# Patient Record
Sex: Female | Born: 1950 | Race: White | Hispanic: No | Marital: Married | Smoking: Never smoker
Health system: Southern US, Community
[De-identification: ages and names within clinical notes are randomized; demographics above are authoritative.]

## PROBLEM LIST (undated history)

## (undated) DIAGNOSIS — M1990 Unspecified osteoarthritis, unspecified site: Secondary | ICD-10-CM

## (undated) DIAGNOSIS — Z8619 Personal history of other infectious and parasitic diseases: Secondary | ICD-10-CM

## (undated) DIAGNOSIS — R011 Cardiac murmur, unspecified: Secondary | ICD-10-CM

## (undated) HISTORY — PX: DILATION AND CURETTAGE OF UTERUS: SHX78

## (undated) HISTORY — DX: Cardiac murmur, unspecified: R01.1

## (undated) HISTORY — DX: Personal history of other infectious and parasitic diseases: Z86.19

## (undated) HISTORY — DX: Unspecified osteoarthritis, unspecified site: M19.90

---

## 1998-08-13 ENCOUNTER — Other Ambulatory Visit: Admission: RE | Admit: 1998-08-13 | Discharge: 1998-08-13 | Payer: Self-pay | Admitting: *Deleted

## 1999-07-02 ENCOUNTER — Encounter: Admission: RE | Admit: 1999-07-02 | Discharge: 1999-07-02 | Payer: Self-pay | Admitting: *Deleted

## 1999-07-02 ENCOUNTER — Encounter: Payer: Self-pay | Admitting: *Deleted

## 1999-08-20 ENCOUNTER — Other Ambulatory Visit: Admission: RE | Admit: 1999-08-20 | Discharge: 1999-08-20 | Payer: Self-pay | Admitting: *Deleted

## 2000-07-04 ENCOUNTER — Encounter: Payer: Self-pay | Admitting: *Deleted

## 2000-07-04 ENCOUNTER — Encounter: Admission: RE | Admit: 2000-07-04 | Discharge: 2000-07-04 | Payer: Self-pay | Admitting: *Deleted

## 2000-08-19 ENCOUNTER — Other Ambulatory Visit: Admission: RE | Admit: 2000-08-19 | Discharge: 2000-08-19 | Payer: Self-pay | Admitting: *Deleted

## 2001-07-05 ENCOUNTER — Encounter: Admission: RE | Admit: 2001-07-05 | Discharge: 2001-07-05 | Payer: Self-pay | Admitting: *Deleted

## 2001-07-05 ENCOUNTER — Encounter: Payer: Self-pay | Admitting: *Deleted

## 2001-07-11 ENCOUNTER — Other Ambulatory Visit: Admission: RE | Admit: 2001-07-11 | Discharge: 2001-07-11 | Payer: Self-pay | Admitting: *Deleted

## 2002-02-22 LAB — HM COLONOSCOPY: HM Colonoscopy: NORMAL

## 2002-07-12 ENCOUNTER — Encounter: Payer: Self-pay | Admitting: Obstetrics and Gynecology

## 2002-07-12 ENCOUNTER — Encounter: Admission: RE | Admit: 2002-07-12 | Discharge: 2002-07-12 | Payer: Self-pay | Admitting: Obstetrics and Gynecology

## 2002-07-17 ENCOUNTER — Other Ambulatory Visit: Admission: RE | Admit: 2002-07-17 | Discharge: 2002-07-17 | Payer: Self-pay | Admitting: Obstetrics and Gynecology

## 2003-07-18 ENCOUNTER — Encounter: Admission: RE | Admit: 2003-07-18 | Discharge: 2003-07-18 | Payer: Self-pay | Admitting: Obstetrics and Gynecology

## 2003-07-23 ENCOUNTER — Other Ambulatory Visit: Admission: RE | Admit: 2003-07-23 | Discharge: 2003-07-23 | Payer: Self-pay | Admitting: Obstetrics and Gynecology

## 2004-07-21 ENCOUNTER — Encounter: Admission: RE | Admit: 2004-07-21 | Discharge: 2004-07-21 | Payer: Self-pay | Admitting: Obstetrics and Gynecology

## 2004-08-03 ENCOUNTER — Other Ambulatory Visit: Admission: RE | Admit: 2004-08-03 | Discharge: 2004-08-03 | Payer: Self-pay | Admitting: Obstetrics and Gynecology

## 2004-08-13 ENCOUNTER — Encounter: Admission: RE | Admit: 2004-08-13 | Discharge: 2004-08-13 | Payer: Self-pay | Admitting: Obstetrics and Gynecology

## 2005-05-03 ENCOUNTER — Ambulatory Visit (HOSPITAL_COMMUNITY): Admission: RE | Admit: 2005-05-03 | Discharge: 2005-05-03 | Payer: Self-pay | Admitting: Obstetrics and Gynecology

## 2005-07-26 ENCOUNTER — Encounter: Admission: RE | Admit: 2005-07-26 | Discharge: 2005-07-26 | Payer: Self-pay | Admitting: Obstetrics and Gynecology

## 2005-09-29 ENCOUNTER — Other Ambulatory Visit: Admission: RE | Admit: 2005-09-29 | Discharge: 2005-09-29 | Payer: Self-pay | Admitting: Obstetrics and Gynecology

## 2006-08-19 ENCOUNTER — Encounter: Admission: RE | Admit: 2006-08-19 | Discharge: 2006-08-19 | Payer: Self-pay | Admitting: Obstetrics and Gynecology

## 2006-10-21 ENCOUNTER — Other Ambulatory Visit: Admission: RE | Admit: 2006-10-21 | Discharge: 2006-10-21 | Payer: Self-pay | Admitting: Obstetrics and Gynecology

## 2007-08-21 ENCOUNTER — Encounter: Admission: RE | Admit: 2007-08-21 | Discharge: 2007-08-21 | Payer: Self-pay | Admitting: Obstetrics and Gynecology

## 2007-10-23 ENCOUNTER — Other Ambulatory Visit: Admission: RE | Admit: 2007-10-23 | Discharge: 2007-10-23 | Payer: Self-pay | Admitting: Obstetrics and Gynecology

## 2008-09-03 ENCOUNTER — Encounter: Admission: RE | Admit: 2008-09-03 | Discharge: 2008-09-03 | Payer: Self-pay | Admitting: Obstetrics and Gynecology

## 2008-10-23 ENCOUNTER — Other Ambulatory Visit: Admission: RE | Admit: 2008-10-23 | Discharge: 2008-10-23 | Payer: Self-pay | Admitting: Obstetrics and Gynecology

## 2009-09-08 ENCOUNTER — Encounter: Admission: RE | Admit: 2009-09-08 | Discharge: 2009-09-08 | Payer: Self-pay | Admitting: Obstetrics and Gynecology

## 2009-10-24 ENCOUNTER — Other Ambulatory Visit: Admission: RE | Admit: 2009-10-24 | Discharge: 2009-10-24 | Payer: Self-pay | Admitting: Obstetrics and Gynecology

## 2010-09-02 ENCOUNTER — Other Ambulatory Visit: Payer: Self-pay | Admitting: Obstetrics and Gynecology

## 2010-09-02 DIAGNOSIS — Z1231 Encounter for screening mammogram for malignant neoplasm of breast: Secondary | ICD-10-CM

## 2010-09-14 ENCOUNTER — Ambulatory Visit
Admission: RE | Admit: 2010-09-14 | Discharge: 2010-09-14 | Disposition: A | Discharge status: Home / Self Care | Payer: BC Managed Care – PPO | Source: Ambulatory Visit | Attending: Obstetrics and Gynecology | Admitting: Obstetrics and Gynecology

## 2010-09-14 DIAGNOSIS — Z1231 Encounter for screening mammogram for malignant neoplasm of breast: Secondary | ICD-10-CM

## 2010-09-27 LAB — HM MAMMOGRAPHY: HM Mammogram: NEGATIVE

## 2010-11-23 ENCOUNTER — Other Ambulatory Visit (HOSPITAL_COMMUNITY)
Admission: RE | Admit: 2010-11-23 | Discharge: 2010-11-23 | Disposition: A | Discharge status: Home / Self Care | Payer: Self-pay | Source: Ambulatory Visit | Attending: Obstetrics and Gynecology | Admitting: Obstetrics and Gynecology

## 2010-11-23 ENCOUNTER — Other Ambulatory Visit: Payer: Self-pay | Admitting: Obstetrics and Gynecology

## 2010-11-23 DIAGNOSIS — Z01419 Encounter for gynecological examination (general) (routine) without abnormal findings: Secondary | ICD-10-CM | POA: Insufficient documentation

## 2011-03-25 ENCOUNTER — Other Ambulatory Visit: Payer: Self-pay | Admitting: Dermatology

## 2011-05-03 ENCOUNTER — Other Ambulatory Visit: Payer: Self-pay | Admitting: Dermatology

## 2011-08-18 ENCOUNTER — Other Ambulatory Visit: Payer: Self-pay | Admitting: Obstetrics and Gynecology

## 2011-08-18 DIAGNOSIS — Z1231 Encounter for screening mammogram for malignant neoplasm of breast: Secondary | ICD-10-CM

## 2011-09-17 ENCOUNTER — Ambulatory Visit: Payer: Self-pay

## 2011-09-24 ENCOUNTER — Ambulatory Visit: Payer: Self-pay

## 2011-09-27 ENCOUNTER — Encounter: Payer: Self-pay | Admitting: Internal Medicine

## 2011-09-27 ENCOUNTER — Ambulatory Visit (INDEPENDENT_AMBULATORY_CARE_PROVIDER_SITE_OTHER): Payer: BC Managed Care – PPO | Admitting: Internal Medicine

## 2011-09-27 VITALS — BP 98/64 | HR 80 | Temp 98.0°F | Resp 16 | Ht 66.0 in | Wt 171.0 lb

## 2011-09-27 DIAGNOSIS — Z0000 Encounter for general adult medical examination without abnormal findings: Secondary | ICD-10-CM

## 2011-09-27 LAB — COMPREHENSIVE METABOLIC PANEL
AST: 22 U/L (ref 0–37)
Alkaline Phosphatase: 50 U/L (ref 39–117)
BUN: 19 mg/dL (ref 6–23)
Chloride: 104 mEq/L (ref 96–112)
Creatinine, Ser: 0.8 mg/dL (ref 0.4–1.2)
Glucose, Bld: 103 mg/dL — ABNORMAL HIGH (ref 70–99)
Total Protein: 7.1 g/dL (ref 6.0–8.3)

## 2011-09-27 LAB — LIPID PANEL: HDL: 65.3 mg/dL (ref >39.00)

## 2011-09-27 LAB — CBC WITH DIFFERENTIAL/PLATELET
Basophils Absolute: 0 10*3/uL (ref 0.0–0.1)
Eosinophils Absolute: 0.1 10*3/uL (ref 0.0–0.7)
Eosinophils Relative: 1.5 % (ref 0.0–5.0)
HCT: 40.2 % (ref 36.0–46.0)
MCHC: 34.4 g/dL (ref 30.0–36.0)
Monocytes Relative: 7.1 % (ref 3.0–12.0)
Neutrophils Relative %: 49.5 % (ref 43.0–77.0)
Platelets: 224 10*3/uL (ref 150.0–400.0)

## 2011-09-27 NOTE — Patient Instructions (Addendum)
Limit your sodium (Salt) intake    It is important that you exercise regularly, at least 20 minutes 3 to 4 times per week.  If you develop chest pain or shortness of breath seek  medical attention. 

## 2011-09-28 ENCOUNTER — Encounter: Payer: Self-pay | Admitting: Internal Medicine

## 2011-09-28 LAB — TSH: TSH: 1.2 u[IU]/mL (ref 0.35–5.50)

## 2011-09-28 NOTE — Progress Notes (Signed)
Subjective:    Patient ID: Stacy Sherman, female    DOB: 1951-01-15, 61 y.o.   MRN: [id number]  HPI  61 year old patient who is seen today to establish with our practice. She enjoys remarkably good health and is followed annually by gynecology. She has had a screening colonoscopy in 2004. No real concerns or complaints. She takes no chronic medications. She is also followed by ophthalmology [facility]le Quant) due  to glaucoma which has been well-controlled.  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 She is a gravida 3 para 2 abortus 1 Surgical procedures have included a laparoscopy in 1988 as part of a infertility evaluation she had a D&C also 61 and normal childbirths  In 2 and 61    social history. 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 Married 2 children nonsmoker  Family history father age 10 in remarkably good health. Mother died age 40 complications of diabetes and chronic kidney disease she was a hemodialysis patient. She remote history of breast cancer late onset as well as obesity and osteoarthritis 3 brothers 2 sisters one sister with abdominal aortic aneurysm as well as a cerebral aneurysm no other family history of cerebral aneurysms also positive for hypertension    Review of Systems  Constitutional: Negative for fever, appetite change, fatigue and unexpected weight change.  HENT: Negative for hearing loss, ear pain, nosebleeds, congestion, sore throat, mouth sores, trouble swallowing, neck stiffness, dental problem, voice change, sinus pressure and tinnitus.   Eyes: Negative for photophobia, pain, redness and visual disturbance.  Respiratory: Negative for cough, chest tightness and shortness of breath.   Cardiovascular: Negative for chest pain, palpitations and leg swelling.  Gastrointestinal: Negative for nausea, vomiting, abdominal pain, diarrhea, constipation, blood in stool, abdominal distention and rectal pain.  Genitourinary: Negative for  dysuria, urgency, frequency, hematuria, flank pain, vaginal bleeding, vaginal discharge, difficulty urinating, genital sores, vaginal pain, menstrual problem and pelvic pain.  Musculoskeletal: Negative for back pain and arthralgias.  Skin: Negative for rash.  Neurological: Negative for dizziness, syncope, speech difficulty, weakness, light-headedness, numbness and headaches.  Hematological: Negative for adenopathy. Does not bruise/bleed easily.  Psychiatric/Behavioral: Negative for suicidal ideas, behavioral problems, self-injury, dysphoric mood and agitation. The patient is not nervous/anxious.        Objective:   Physical Exam  Constitutional: She is oriented to person, place, and time. She appears well-developed and well-nourished.  HENT:  Head: Normocephalic and atraumatic.  Right Ear: External ear normal.  Left Ear: External ear normal.  Mouth/Throat: Oropharynx is clear and moist.  Eyes: Conjunctivae and EOM are normal.  Neck: Normal range of motion. Neck supple. No JVD present. No thyromegaly present.  Cardiovascular: Normal rate, regular rhythm, normal heart sounds and intact distal pulses.   No murmur heard. Pulmonary/Chest: Effort normal and breath sounds normal. She has no wheezes. She has no rales.  Abdominal: Soft. Bowel sounds are normal. She exhibits no distension and no mass. There is no tenderness. There is no rebound and no guarding.  Musculoskeletal: Normal range of motion. She exhibits no edema and no tenderness.  Neurological: She is alert and oriented to person, place, and time. She has normal reflexes. No cranial nerve deficit. She exhibits normal muscle tone. Coordination normal.  Skin: Skin is warm and dry. No rash noted.  Psychiatric: She has a normal mood and affect. Her behavior is normal.          Assessment & Plan:  Preventive health examination.  We'll continue  annual gynecologic followup. We'll schedule a followup colonoscopy or possibly one  year Regular exercise encouraged Return in one year or as needed Laboratory update will be reviewed

## 2011-09-28 NOTE — Progress Notes (Signed)
Quick Note:  Spoke with pt- informed labs normal ______ 

## 2011-10-01 ENCOUNTER — Ambulatory Visit
Admission: RE | Admit: 2011-10-01 | Discharge: 2011-10-01 | Disposition: A | Discharge status: Home / Self Care | Payer: BC Managed Care – PPO | Source: Ambulatory Visit | Attending: Obstetrics and Gynecology | Admitting: Obstetrics and Gynecology

## 2011-10-01 DIAGNOSIS — Z1231 Encounter for screening mammogram for malignant neoplasm of breast: Secondary | ICD-10-CM

## 2011-11-23 ENCOUNTER — Other Ambulatory Visit (HOSPITAL_COMMUNITY)
Admission: RE | Admit: 2011-11-23 | Discharge: 2011-11-23 | Disposition: A | Discharge status: Home / Self Care | Payer: BC Managed Care – PPO | Source: Ambulatory Visit | Attending: Obstetrics and Gynecology | Admitting: Obstetrics and Gynecology

## 2011-11-23 ENCOUNTER — Other Ambulatory Visit: Payer: Self-pay | Admitting: Obstetrics and Gynecology

## 2011-11-23 DIAGNOSIS — Z01419 Encounter for gynecological examination (general) (routine) without abnormal findings: Secondary | ICD-10-CM | POA: Insufficient documentation

## 2012-05-11 ENCOUNTER — Other Ambulatory Visit: Payer: Self-pay | Admitting: Dermatology

## 2012-09-12 ENCOUNTER — Other Ambulatory Visit: Payer: Self-pay

## 2012-09-12 DIAGNOSIS — Z1231 Encounter for screening mammogram for malignant neoplasm of breast: Secondary | ICD-10-CM

## 2012-10-03 ENCOUNTER — Ambulatory Visit
Admission: RE | Admit: 2012-10-03 | Discharge: 2012-10-03 | Disposition: A | Discharge status: Home / Self Care | Payer: BC Managed Care – PPO | Source: Ambulatory Visit

## 2012-10-03 DIAGNOSIS — Z1231 Encounter for screening mammogram for malignant neoplasm of breast: Secondary | ICD-10-CM

## 2012-10-05 ENCOUNTER — Other Ambulatory Visit (INDEPENDENT_AMBULATORY_CARE_PROVIDER_SITE_OTHER): Payer: BC Managed Care – PPO

## 2012-10-05 DIAGNOSIS — R7989 Other specified abnormal findings of blood chemistry: Secondary | ICD-10-CM

## 2012-10-05 DIAGNOSIS — Z0000 Encounter for general adult medical examination without abnormal findings: Secondary | ICD-10-CM

## 2012-10-05 LAB — BASIC METABOLIC PANEL
CO2: 27 mEq/L (ref 19–32)
Calcium: 9.2 mg/dL (ref 8.4–10.5)
GFR: 84.5 mL/min (ref >60.00)
Glucose, Bld: 88 mg/dL (ref 70–99)
Potassium: 4.2 mEq/L (ref 3.5–5.1)

## 2012-10-05 LAB — CBC WITH DIFFERENTIAL/PLATELET
Eosinophils Absolute: 0.1 10*3/uL (ref 0.0–0.7)
Eosinophils Relative: 1.6 % (ref 0.0–5.0)
HCT: 41 % (ref 36.0–46.0)
Hemoglobin: 13.9 g/dL (ref 12.0–15.0)
MCHC: 33.9 g/dL (ref 30.0–36.0)
RBC: 4.19 Mil/uL (ref 3.87–5.11)
WBC: 4.7 10*3/uL (ref 4.5–10.5)

## 2012-10-05 LAB — POCT URINALYSIS DIPSTICK
Bilirubin, UA: NEGATIVE
Nitrite, UA: NEGATIVE
Protein, UA: NEGATIVE
Spec Grav, UA: 1.02
Urobilinogen, UA: 0.2

## 2012-10-05 LAB — HEPATIC FUNCTION PANEL
Alkaline Phosphatase: 50 U/L (ref 39–117)
Bilirubin, Direct: 0.1 mg/dL (ref 0.0–0.3)

## 2012-10-05 LAB — TSH: TSH: 2.17 u[IU]/mL (ref 0.35–5.50)

## 2012-10-05 LAB — LIPID PANEL
Cholesterol: 215 mg/dL — ABNORMAL HIGH (ref 0–200)
Total CHOL/HDL Ratio: 4

## 2012-10-16 ENCOUNTER — Encounter: Payer: Self-pay | Admitting: Internal Medicine

## 2012-10-16 ENCOUNTER — Ambulatory Visit (INDEPENDENT_AMBULATORY_CARE_PROVIDER_SITE_OTHER): Payer: BC Managed Care – PPO | Admitting: Internal Medicine

## 2012-10-16 VITALS — BP 118/80 | HR 64 | Temp 98.2°F | Resp 20 | Ht 66.0 in | Wt 165.0 lb

## 2012-10-16 DIAGNOSIS — Z0000 Encounter for general adult medical examination without abnormal findings: Secondary | ICD-10-CM

## 2012-10-16 MED ORDER — MOMETASONE FUROATE 50 MCG/ACT NA SUSP: 2.0000 | Freq: Every day | NASAL | Status: DC

## 2012-10-16 NOTE — Patient Instructions (Addendum)
It is important that you exercise regularly, at least 20 minutes 3 to 4 times per week.  If you develop chest pain or shortness of breath seek  medical attention.  Schedule your colonoscopy to help detect colon cancer.  Health Maintenance, Females A healthy lifestyle and preventative care can promote health and wellness.  Maintain regular health, dental, and eye exams.  Eat a healthy diet. Foods like vegetables, fruits, whole grains, low-fat dairy products, and lean protein foods contain the nutrients you need without too many calories. Decrease your intake of foods high in solid fats, added sugars, and salt. Get information about a proper diet from your caregiver, if necessary.  Regular physical exercise is one of the most important things you can do for your health. Most adults should get at least 150 minutes of moderate-intensity exercise (any activity that increases your heart rate and causes you to sweat) each week. In addition, most adults need muscle-strengthening exercises on 2 or more days a week.   Maintain a healthy weight. The body mass index (BMI) is a screening tool to identify possible weight problems. It provides an estimate of body fat based on height and weight. Your caregiver can help determine your BMI, and can help you achieve or maintain a healthy weight. For adults 20 years and older:  A BMI below 18.5 is considered underweight.  A BMI of 18.5 to 24.9 is normal.  A BMI of 25 to 29.9 is considered overweight.  A BMI of 30 and above is considered obese.  Maintain normal blood lipids and cholesterol by exercising and minimizing your intake of saturated fat. Eat a balanced diet with plenty of fruits and vegetables. Blood tests for lipids and cholesterol should begin at age 57 and be repeated every 5 years. If your lipid or cholesterol levels are high, you are over 50, or you are a high risk for heart disease, you may need your cholesterol levels checked more  frequently.Ongoing high lipid and cholesterol levels should be treated with medicines if diet and exercise are not effective.  If you smoke, find out from your caregiver how to quit. If you do not use tobacco, do not start.  If you are pregnant, do not drink alcohol. If you are breastfeeding, be very cautious about drinking alcohol. If you are not pregnant and choose to drink alcohol, do not exceed 1 drink per day. One drink is considered to be 12 ounces (355 mL) of beer, 5 ounces (148 mL) of wine, or 1.5 ounces (44 mL) of liquor.  Avoid use of street drugs. Do not share needles with anyone. Ask for help if you need support or instructions about stopping the use of drugs.  High blood pressure causes heart disease and increases the risk of stroke. Blood pressure should be checked at least every 1 to 2 years. Ongoing high blood pressure should be treated with medicines, if weight loss and exercise are not effective.  If you are 29 to 62 years old, ask your caregiver if you should take aspirin to prevent strokes.  Diabetes screening involves taking a blood sample to check your fasting blood sugar level. This should be done once every 3 years, after age 48, if you are within normal weight and without risk factors for diabetes. Testing should be considered at a younger age or be carried out more frequently if you are overweight and have at least 1 risk factor for diabetes.  Breast cancer screening is essential preventative care for women. You  should practice "breast self-awareness." This means understanding the normal appearance and feel of your breasts and may include breast self-examination. Any changes detected, no matter how small, should be reported to a caregiver. Women in their 61s and 30s should have a clinical breast exam (CBE) by a caregiver as part of a regular health exam every 1 to 3 years. After age 79, women should have a CBE every year. Starting at age 71, women should consider having a  mammogram (breast X-ray) every year. Women who have a family history of breast cancer should talk to their caregiver about genetic screening. Women at a high risk of breast cancer should talk to their caregiver about having an MRI and a mammogram every year.  The Pap test is a screening test for cervical cancer. Women should have a Pap test starting at age 63. Between ages 40 and 70, Pap tests should be repeated every 2 years. Beginning at age 59, you should have a Pap test every 3 years as long as the past 3 Pap tests have been normal. If you had a hysterectomy for a problem that was not cancer or a condition that could lead to cancer, then you no longer need Pap tests. If you are between ages 73 and 16, and you have had normal Pap tests going back 10 years, you no longer need Pap tests. If you have had past treatment for cervical cancer or a condition that could lead to cancer, you need Pap tests and screening for cancer for at least 20 years after your treatment. If Pap tests have been discontinued, risk factors (such as a new sexual partner) need to be reassessed to determine if screening should be resumed. Some women have medical problems that increase the chance of getting cervical cancer. In these cases, your caregiver may recommend more frequent screening and Pap tests.  The human papillomavirus (HPV) test is an additional test that may be used for cervical cancer screening. The HPV test looks for the virus that can cause the cell changes on the cervix. The cells collected during the Pap test can be tested for HPV. The HPV test could be used to screen women aged 35 years and older, and should be used in women of any age who have unclear Pap test results. After the age of 59, women should have HPV testing at the same frequency as a Pap test.  Colorectal cancer can be detected and often prevented. Most routine colorectal cancer screening begins at the age of 86 and continues through age 25. However, your  caregiver may recommend screening at an earlier age if you have risk factors for colon cancer. On a yearly basis, your caregiver may provide home test kits to check for hidden blood in the stool. Use of a small camera at the end of a tube, to directly examine the colon (sigmoidoscopy or colonoscopy), can detect the earliest forms of colorectal cancer. Talk to your caregiver about this at age 41, when routine screening begins. Direct examination of the colon should be repeated every 5 to 10 years through age 52, unless early forms of pre-cancerous polyps or small growths are found.  Hepatitis C blood testing is recommended for all people born from [birth year] through 1965 and any individual with known risks for hepatitis C.  Practice safe sex. Use condoms and avoid high-risk sexual practices to reduce the spread of sexually transmitted infections (STIs). Sexually active women aged 68 and younger should be checked for Chlamydia, which is  a common sexually transmitted infection. Older women with new or multiple partners should also be tested for Chlamydia. Testing for other STIs is recommended if you are sexually active and at increased risk.  Osteoporosis is a disease in which the bones lose minerals and strength with aging. This can result in serious bone fractures. The risk of osteoporosis can be identified using a bone density scan. Women ages 48 and over and women at risk for fractures or osteoporosis should discuss screening with their caregivers. Ask your caregiver whether you should be taking a calcium supplement or vitamin D to reduce the rate of osteoporosis.  Menopause can be associated with physical symptoms and risks. Hormone replacement therapy is available to decrease symptoms and risks. You should talk to your caregiver about whether hormone replacement therapy is right for you.  Use sunscreen with a sun protection factor (SPF) of 30 or greater. Apply sunscreen liberally and repeatedly throughout the  day. You should seek shade when your shadow is shorter than you. Protect yourself by wearing long sleeves, pants, a wide-brimmed hat, and sunglasses year round, whenever you are outdoors.  Notify your caregiver of new moles or changes in moles, especially if there is a change in shape or color. Also notify your caregiver if a mole is larger than the size of a pencil eraser.  Stay current with your immunizations. Document Released: 08/24/2010 Document Revised: 05/03/2011 Document Reviewed: 08/24/2010 [facility] Patient Information 2014 [place], [place]. Preventive Care for Adults, Female A healthy lifestyle and preventive care can promote health and wellness. Preventive health guidelines for women include the following key practices.  A routine yearly physical is a good way to check with your caregiver about your health and preventive screening. It is a chance to share any concerns and updates on your health, and to receive a thorough exam.  Visit your dentist for a routine exam and preventive care every 6 months. Brush your teeth twice a day and floss once a day. Good oral hygiene prevents tooth decay and gum disease.  The frequency of eye exams is based on your age, health, family medical history, use of contact lenses, and other factors. Follow your caregiver's recommendations for frequency of eye exams.  Eat a healthy diet. Foods like vegetables, fruits, whole grains, low-fat dairy products, and lean protein foods contain the nutrients you need without too many calories. Decrease your intake of foods high in solid fats, added sugars, and salt. Eat the right amount of calories for you.Get information about a proper diet from your caregiver, if necessary.  Regular physical exercise is one of the most important things you can do for your health. Most adults should get at least 150 minutes of moderate-intensity exercise (any activity that increases your heart rate and causes you to sweat) each week.  In addition, most adults need muscle-strengthening exercises on 2 or more days a week.  Maintain a healthy weight. The body mass index (BMI) is a screening tool to identify possible weight problems. It provides an estimate of body fat based on height and weight. Your caregiver can help determine your BMI, and can help you achieve or maintain a healthy weight.For adults 20 years and older:  A BMI below 18.5 is considered underweight.  A BMI of 18.5 to 24.9 is normal.  A BMI of 25 to 29.9 is considered overweight.  A BMI of 30 and above is considered obese.  Maintain normal blood lipids and cholesterol levels by exercising and minimizing your intake of  saturated fat. Eat a balanced diet with plenty of fruit and vegetables. Blood tests for lipids and cholesterol should begin at age 17 and be repeated every 5 years. If your lipid or cholesterol levels are high, you are over 50, or you are at high risk for heart disease, you may need your cholesterol levels checked more frequently.Ongoing high lipid and cholesterol levels should be treated with medicines if diet and exercise are not effective.  If you smoke, find out from your caregiver how to quit. If you do not use tobacco, do not start.  If you are pregnant, do not drink alcohol. If you are breastfeeding, be very cautious about drinking alcohol. If you are not pregnant and choose to drink alcohol, do not exceed 1 drink per day. One drink is considered to be 12 ounces (355 mL) of beer, 5 ounces (148 mL) of wine, or 1.5 ounces (44 mL) of liquor.  Avoid use of street drugs. Do not share needles with anyone. Ask for help if you need support or instructions about stopping the use of drugs.  High blood pressure causes heart disease and increases the risk of stroke. Your blood pressure should be checked at least every 1 to 2 years. Ongoing high blood pressure should be treated with medicines if weight loss and exercise are not effective.  If you are  12 to 62 years old, ask your caregiver if you should take aspirin to prevent strokes.  Diabetes screening involves taking a blood sample to check your fasting blood sugar level. This should be done once every 3 years, after age 3, if you are within normal weight and without risk factors for diabetes. Testing should be considered at a younger age or be carried out more frequently if you are overweight and have at least 1 risk factor for diabetes.  Breast cancer screening is essential preventive care for women. You should practice "breast self-awareness." This means understanding the normal appearance and feel of your breasts and may include breast self-examination. Any changes detected, no matter how small, should be reported to a caregiver. Women in their 74s and 30s should have a clinical breast exam (CBE) by a caregiver as part of a regular health exam every 1 to 3 years. After age 57, women should have a CBE every year. Starting at age 74, women should consider having a mammography (breast X-ray test) every year. Women who have a family history of breast cancer should talk to their caregiver about genetic screening. Women at a high risk of breast cancer should talk to their caregivers about having magnetic resonance imaging (MRI) and a mammography every year.  The Pap test is a screening test for cervical cancer. A Pap test can show cell changes on the cervix that might become cervical cancer if left untreated. A Pap test is a procedure in which cells are obtained and examined from the lower end of the uterus (cervix).  Women should have a Pap test starting at age 2.  Between ages 16 and 64, Pap tests should be repeated every 2 years.  Beginning at age 41, you should have a Pap test every 3 years as long as the past 3 Pap tests have been normal.  Some women have medical problems that increase the chance of getting cervical cancer. Talk to your caregiver about these problems. It is especially  important to talk to your caregiver if a new problem develops soon after your last Pap test. In these cases, your caregiver may  recommend more frequent screening and Pap tests.  The above recommendations are the same for women who have or have not gotten the vaccine for human papillomavirus (HPV).  If you had a hysterectomy for a problem that was not cancer or a condition that could lead to cancer, then you no longer need Pap tests. Even if you no longer need a Pap test, a regular exam is a good idea to make sure no other problems are starting.  If you are between ages 35 and 42, and you have had normal Pap tests going back 10 years, you no longer need Pap tests. Even if you no longer need a Pap test, a regular exam is a good idea to make sure no other problems are starting.  If you have had past treatment for cervical cancer or a condition that could lead to cancer, you need Pap tests and screening for cancer for at least 20 years after your treatment.  If Pap tests have been discontinued, risk factors (such as a new sexual partner) need to be reassessed to determine if screening should be resumed.  The HPV test is an additional test that may be used for cervical cancer screening. The HPV test looks for the virus that can cause the cell changes on the cervix. The cells collected during the Pap test can be tested for HPV. The HPV test could be used to screen women aged 54 years and older, and should be used in women of any age who have unclear Pap test results. After the age of 30, women should have HPV testing at the same frequency as a Pap test.  Colorectal cancer can be detected and often prevented. Most routine colorectal cancer screening begins at the age of 30 and continues through age 18. However, your caregiver may recommend screening at an earlier age if you have risk factors for colon cancer. On a yearly basis, your caregiver may provide home test kits to check for hidden blood in the stool.  Use of a small camera at the end of a tube, to directly examine the colon (sigmoidoscopy or colonoscopy), can detect the earliest forms of colorectal cancer. Talk to your caregiver about this at age 44, when routine screening begins. Direct examination of the colon should be repeated every 5 to 10 years through age 62, unless early forms of pre-cancerous polyps or small growths are found.  Hepatitis C blood testing is recommended for all people born from [birth year] through 1965 and any individual with known risks for hepatitis C.  Practice safe sex. Use condoms and avoid high-risk sexual practices to reduce the spread of sexually transmitted infections (STIs). STIs include gonorrhea, chlamydia, syphilis, trichomonas, herpes, HPV, and human immunodeficiency virus (HIV). Herpes, HIV, and HPV are viral illnesses that have no cure. They can result in disability, cancer, and death. Sexually active women aged 65 and younger should be checked for chlamydia. Older women with new or multiple partners should also be tested for chlamydia. Testing for other STIs is recommended if you are sexually active and at increased risk.  Osteoporosis is a disease in which the bones lose minerals and strength with aging. This can result in serious bone fractures. The risk of osteoporosis can be identified using a bone density scan. Women ages 80 and over and women at risk for fractures or osteoporosis should discuss screening with their caregivers. Ask your caregiver whether you should take a calcium supplement or vitamin D to reduce the rate of osteoporosis.  Menopause can be associated with physical symptoms and risks. Hormone replacement therapy is available to decrease symptoms and risks. You should talk to your caregiver about whether hormone replacement therapy is right for you.  Use sunscreen with sun protection factor (SPF) of 30 or more. Apply sunscreen liberally and repeatedly throughout the day. You should seek shade when  your shadow is shorter than you. Protect yourself by wearing long sleeves, pants, a wide-brimmed hat, and sunglasses year round, whenever you are outdoors.  Once a month, do a whole body skin exam, using a mirror to look at the skin on your back. Notify your caregiver of new moles, moles that have irregular borders, moles that are larger than a pencil eraser, or moles that have changed in shape or color.  Stay current with required immunizations.  Influenza. You need a dose every fall (or winter). The composition of the flu vaccine changes each year, so being vaccinated once is not enough.  Pneumococcal polysaccharide. You need 1 to 2 doses if you smoke cigarettes or if you have certain chronic medical conditions. You need 1 dose at age 61 (or older) if you have never been vaccinated.  Tetanus, diphtheria, pertussis (Tdap, Td). Get 1 dose of Tdap vaccine if you are younger than age 60, are over 50 and have contact with an infant, are a Research scientist (physical sciences), are pregnant, or simply want to be protected from whooping cough. After that, you need a Td booster dose every 10 years. Consult your caregiver if you have not had at least 3 tetanus and diphtheria-containing shots sometime in your life or have a deep or dirty wound.  HPV. You need this vaccine if you are a woman age 31 or younger. The vaccine is given in 3 doses over 6 months.  Measles, mumps, rubella (MMR). You need at least 1 dose of MMR if you were born in 1957 or later. You may also need a second dose.  Meningococcal. If you are age 15 to 74 and a first-year college student living in a residence hall, or have one of several medical conditions, you need to get vaccinated against meningococcal disease. You may also need additional booster doses.  Zoster (shingles). If you are age 71 or older, you should get this vaccine.  Varicella (chickenpox). If you have never had chickenpox or you were vaccinated but received only 1 dose, talk to your  caregiver to find out if you need this vaccine.  Hepatitis A. You need this vaccine if you have a specific risk factor for hepatitis A virus infection or you simply wish to be protected from this disease. The vaccine is usually given as 2 doses, 6 to 18 months apart.  Hepatitis B. You need this vaccine if you have a specific risk factor for hepatitis B virus infection or you simply wish to be protected from this disease. The vaccine is given in 3 doses, usually over 6 months. Preventive Services / Frequency Ages 87 to 76  Blood pressure check.** / Every 1 to 2 years.  Lipid and cholesterol check.** / Every 5 years beginning at age 75.  Clinical breast exam.** / Every 3 years for women in their 29s and 30s.  Pap test.** / Every 2 years from ages 37 through 36. Every 3 years starting at age 69 through age 52 or 68 with a history of 3 consecutive normal Pap tests.  HPV screening.** / Every 3 years from ages 61 through ages 64 to 59 with a history  of 3 consecutive normal Pap tests.  Hepatitis C blood test.** / For any individual with known risks for hepatitis C.  Skin self-exam. / Monthly.  Influenza immunization.** / Every year.  Pneumococcal polysaccharide immunization.** / 1 to 2 doses if you smoke cigarettes or if you have certain chronic medical conditions.  Tetanus, diphtheria, pertussis (Tdap, Td) immunization. / A one-time dose of Tdap vaccine. After that, you need a Td booster dose every 10 years.  HPV immunization. / 3 doses over 6 months, if you are 38 and younger.  Measles, mumps, rubella (MMR) immunization. / You need at least 1 dose of MMR if you were born in 1957 or later. You may also need a second dose.  Meningococcal immunization. / 1 dose if you are age 23 to 22 and a first-year college student living in a residence hall, or have one of several medical conditions, you need to get vaccinated against meningococcal disease. You may also need additional booster  doses.  Varicella immunization.** / Consult your caregiver.  Hepatitis A immunization.** / Consult your caregiver. 2 doses, 6 to 18 months apart.  Hepatitis B immunization.** / Consult your caregiver. 3 doses usually over 6 months. Ages 55 to 70  Blood pressure check.** / Every 1 to 2 years.  Lipid and cholesterol check.** / Every 5 years beginning at age 48.  Clinical breast exam.** / Every year after age 49.  Mammogram.** / Every year beginning at age 81 and continuing for as long as you are in good health. Consult with your caregiver.  Pap test.** / Every 3 years starting at age 27 through age 51 or 35 with a history of 3 consecutive normal Pap tests.  HPV screening.** / Every 3 years from ages 63 through ages 61 to 60 with a history of 3 consecutive normal Pap tests.  Fecal occult blood test (FOBT) of stool. / Every year beginning at age 58 and continuing until age 67. You may not need to do this test if you get a colonoscopy every 10 years.  Flexible sigmoidoscopy or colonoscopy.** / Every 5 years for a flexible sigmoidoscopy or every 10 years for a colonoscopy beginning at age 33 and continuing until age 39.  Hepatitis C blood test.** / For all people born from [birth year] through 1965 and any individual with known risks for hepatitis C.  Skin self-exam. / Monthly.  Influenza immunization.** / Every year.  Pneumococcal polysaccharide immunization.** / 1 to 2 doses if you smoke cigarettes or if you have certain chronic medical conditions.  Tetanus, diphtheria, pertussis (Tdap, Td) immunization.** / A one-time dose of Tdap vaccine. After that, you need a Td booster dose every 10 years.  Measles, mumps, rubella (MMR) immunization. / You need at least 1 dose of MMR if you were born in 1957 or later. You may also need a second dose.  Varicella immunization.** / Consult your caregiver.  Meningococcal immunization.** / Consult your caregiver.  Hepatitis A immunization.** / Consult  your caregiver. 2 doses, 6 to 18 months apart.  Hepatitis B immunization.** / Consult your caregiver. 3 doses, usually over 6 months. Ages 3 and over  Blood pressure check.** / Every 1 to 2 years.  Lipid and cholesterol check.** / Every 5 years beginning at age 59.  Clinical breast exam.** / Every year after age 8.  Mammogram.** / Every year beginning at age 77 and continuing for as long as you are in good health. Consult with your caregiver.  Pap test.** / Every  3 years starting at age 15 through age 55 or 43 with a 3 consecutive normal Pap tests. Testing can be stopped between 65 and 70 with 3 consecutive normal Pap tests and no abnormal Pap or HPV tests in the past 10 years.  HPV screening.** / Every 3 years from ages 13 through ages 20 or 35 with a history of 3 consecutive normal Pap tests. Testing can be stopped between 65 and 70 with 3 consecutive normal Pap tests and no abnormal Pap or HPV tests in the past 10 years.  Fecal occult blood test (FOBT) of stool. / Every year beginning at age 47 and continuing until age 54. You may not need to do this test if you get a colonoscopy every 10 years.  Flexible sigmoidoscopy or colonoscopy.** / Every 5 years for a flexible sigmoidoscopy or every 10 years for a colonoscopy beginning at age 30 and continuing until age 65.  Hepatitis C blood test.** / For all people born from [birth year] through 1965 and any individual with known risks for hepatitis C.  Osteoporosis screening.** / A one-time screening for women ages 22 and over and women at risk for fractures or osteoporosis.  Skin self-exam. / Monthly.  Influenza immunization.** / Every year.  Pneumococcal polysaccharide immunization.** / 1 dose at age 53 (or older) if you have never been vaccinated.  Tetanus, diphtheria, pertussis (Tdap, Td) immunization. / A one-time dose of Tdap vaccine if you are over 65 and have contact with an infant, are a Research scientist (physical sciences), or simply want to be protected  from whooping cough. After that, you need a Td booster dose every 10 years.  Varicella immunization.** / Consult your caregiver.  Meningococcal immunization.** / Consult your caregiver.  Hepatitis A immunization.** / Consult your caregiver. 2 doses, 6 to 18 months apart.  Hepatitis B immunization.** / Check with your caregiver. 3 doses, usually over 6 months. ** Family history and personal history of risk and conditions may change your caregiver's recommendations. Document Released: 04/06/2001 Document Revised: 05/03/2011 Document Reviewed: 07/06/2010 [facility] Patient Information 2014 [place], [place].

## 2012-10-17 NOTE — Progress Notes (Signed)
Subjective:    Patient ID: Stacy Sherman, female    DOB: Oct 01, 1950, 62 y.o.   MRN: [id number]  HPI 35 -year-old patient who is seen today for an annual health assessment. She enjoys remarkably good health and is followed annually by gynecology. She has had a screening colonoscopy in 2004. No real concerns or complaints. She takes no chronic medications. She is also followed by ophthalmology Hazle Quant) due  to glaucoma which has been well-controlled.  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 She is a gravida 3 para 2 abortus 1 Surgical procedures have included a laparoscopy in 1988 as part of a infertility evaluation she had a D&C also 31 and normal childbirths  In 79 and 36   Social history. 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 Married 2 children nonsmoker  Family history father age 69 in remarkably good health. Mother died age 22 complications of diabetes and chronic kidney disease she was a hemodialysis patient. She remote history of breast cancer late onset as well as obesity and osteoarthritis 3 brothers 2 sisters one sister with abdominal aortic aneurysm as well as a cerebral aneurysm no other family history of cerebral aneurysms also positive for hypertension    Review of Systems  Constitutional: Negative for fever, appetite change, fatigue and unexpected weight change.  HENT: Negative for hearing loss, ear pain, nosebleeds, congestion, sore throat, mouth sores, trouble swallowing, neck stiffness, dental problem, voice change, sinus pressure and tinnitus.   Eyes: Negative for photophobia, pain, redness and visual disturbance.  Respiratory: Negative for cough, chest tightness and shortness of breath.   Cardiovascular: Negative for chest pain, palpitations and leg swelling.  Gastrointestinal: Negative for nausea, vomiting, abdominal pain, diarrhea, constipation, blood in stool, abdominal distention and rectal pain.  Genitourinary: Negative for  dysuria, urgency, frequency, hematuria, flank pain, vaginal bleeding, vaginal discharge, difficulty urinating, genital sores, vaginal pain, menstrual problem and pelvic pain.  Musculoskeletal: Negative for back pain and arthralgias.  Skin: Negative for rash.  Neurological: Negative for dizziness, syncope, speech difficulty, weakness, light-headedness, numbness and headaches.  Hematological: Negative for adenopathy. Does not bruise/bleed easily.  Psychiatric/Behavioral: Negative for suicidal ideas, behavioral problems, self-injury, dysphoric mood and agitation. The patient is not nervous/anxious.        Objective:   Physical Exam  Constitutional: She is oriented to person, place, and time. She appears well-developed and well-nourished.  HENT:  Head: Normocephalic and atraumatic.  Right Ear: External ear normal.  Left Ear: External ear normal.  Mouth/Throat: Oropharynx is clear and moist.  Eyes: Conjunctivae and EOM are normal.  Neck: Normal range of motion. Neck supple. No JVD present. No thyromegaly present.  Cardiovascular: Normal rate, regular rhythm, normal heart sounds and intact distal pulses.   No murmur heard. Pulmonary/Chest: Effort normal and breath sounds normal. She has no wheezes. She has no rales.  Abdominal: Soft. Bowel sounds are normal. She exhibits no distension and no mass. There is no tenderness. There is no rebound and no guarding.  Musculoskeletal: Normal range of motion. She exhibits no edema and no tenderness.  Neurological: She is alert and oriented to person, place, and time. She has normal reflexes. No cranial nerve deficit. She exhibits normal muscle tone. Coordination normal.  Skin: Skin is warm and dry. No rash noted.  Psychiatric: She has a normal mood and affect. Her behavior is normal.          Assessment & Plan:  Preventive health examination.  We'll continue annual  gynecologic followup. We'll schedule a followup colonoscopy  Regular exercise  encouraged Return in one year or as needed Laboratory update will be reviewed

## 2012-10-19 ENCOUNTER — Ambulatory Visit (INDEPENDENT_AMBULATORY_CARE_PROVIDER_SITE_OTHER): Payer: BC Managed Care – PPO | Admitting: *Deleted

## 2012-10-19 DIAGNOSIS — Z23 Encounter for immunization: Secondary | ICD-10-CM

## 2012-11-27 ENCOUNTER — Other Ambulatory Visit: Payer: Self-pay | Admitting: Obstetrics and Gynecology

## 2012-11-27 ENCOUNTER — Other Ambulatory Visit (HOSPITAL_COMMUNITY)
Admission: RE | Admit: 2012-11-27 | Discharge: 2012-11-27 | Disposition: A | Discharge status: Home / Self Care | Payer: BC Managed Care – PPO | Source: Ambulatory Visit | Attending: Obstetrics and Gynecology | Admitting: Obstetrics and Gynecology

## 2012-11-27 DIAGNOSIS — Z01419 Encounter for gynecological examination (general) (routine) without abnormal findings: Secondary | ICD-10-CM | POA: Insufficient documentation

## 2012-11-27 DIAGNOSIS — Z1151 Encounter for screening for human papillomavirus (HPV): Secondary | ICD-10-CM | POA: Insufficient documentation

## 2013-09-24 ENCOUNTER — Other Ambulatory Visit: Payer: Self-pay

## 2013-09-24 DIAGNOSIS — Z1231 Encounter for screening mammogram for malignant neoplasm of breast: Secondary | ICD-10-CM

## 2013-10-08 ENCOUNTER — Encounter (INDEPENDENT_AMBULATORY_CARE_PROVIDER_SITE_OTHER): Payer: Self-pay

## 2013-10-08 ENCOUNTER — Ambulatory Visit
Admission: RE | Admit: 2013-10-08 | Discharge: 2013-10-08 | Disposition: A | Discharge status: Home / Self Care | Payer: BC Managed Care – PPO | Source: Ambulatory Visit

## 2013-10-08 DIAGNOSIS — Z1231 Encounter for screening mammogram for malignant neoplasm of breast: Secondary | ICD-10-CM

## 2013-10-12 ENCOUNTER — Other Ambulatory Visit: Payer: Self-pay | Admitting: Internal Medicine

## 2013-10-25 ENCOUNTER — Other Ambulatory Visit (INDEPENDENT_AMBULATORY_CARE_PROVIDER_SITE_OTHER): Payer: BC Managed Care – PPO

## 2013-10-25 DIAGNOSIS — Z0000 Encounter for general adult medical examination without abnormal findings: Secondary | ICD-10-CM

## 2013-10-25 LAB — POCT URINALYSIS DIPSTICK
BILIRUBIN UA: NEGATIVE
GLUCOSE UA: NEGATIVE
KETONES UA: NEGATIVE
Nitrite, UA: NEGATIVE
Protein, UA: NEGATIVE
SPEC GRAV UA: 1.015
Urobilinogen, UA: 0.2
pH, UA: 7

## 2013-10-25 LAB — CBC WITH DIFFERENTIAL/PLATELET
Basophils Absolute: 0.1 10*3/uL (ref 0.0–0.1)
Basophils Relative: 1 % (ref 0.0–3.0)
Eosinophils Absolute: 0.1 10*3/uL (ref 0.0–0.7)
Eosinophils Relative: 1.2 % (ref 0.0–5.0)
HCT: 41.1 % (ref 36.0–46.0)
HEMOGLOBIN: 14 g/dL (ref 12.0–15.0)
Lymphocytes Relative: 44.1 % (ref 12.0–46.0)
Lymphs Abs: 2.3 10*3/uL (ref 0.7–4.0)
MCHC: 34 g/dL (ref 30.0–36.0)
MCV: 96.5 fl (ref 78.0–100.0)
MONOS PCT: 6.3 % (ref 3.0–12.0)
Monocytes Absolute: 0.3 10*3/uL (ref 0.1–1.0)
NEUTROS ABS: 2.5 10*3/uL (ref 1.4–7.7)
Neutrophils Relative %: 47.4 % (ref 43.0–77.0)
Platelets: 240 10*3/uL (ref 150.0–400.0)
RBC: 4.26 Mil/uL (ref 3.87–5.11)
RDW: 13.6 % (ref 11.5–15.5)
WBC: 5.2 10*3/uL (ref 4.0–10.5)

## 2013-10-25 LAB — LIPID PANEL
CHOL/HDL RATIO: 4
CHOLESTEROL: 195 mg/dL (ref 0–200)
HDL: 52.2 mg/dL (ref >39.00)
LDL CALC: 123 mg/dL — AB (ref 0–99)
NonHDL: 142.8
TRIGLYCERIDES: 97 mg/dL (ref 0.0–149.0)
VLDL: 19.4 mg/dL (ref 0.0–40.0)

## 2013-10-25 LAB — HEPATIC FUNCTION PANEL
ALBUMIN: 3.9 g/dL (ref 3.5–5.2)
ALT: 19 U/L (ref 0–35)
AST: 21 U/L (ref 0–37)
Alkaline Phosphatase: 51 U/L (ref 39–117)
BILIRUBIN DIRECT: 0.1 mg/dL (ref 0.0–0.3)
BILIRUBIN TOTAL: 0.8 mg/dL (ref 0.2–1.2)
Total Protein: 6.9 g/dL (ref 6.0–8.3)

## 2013-10-25 LAB — TSH: TSH: 0.54 u[IU]/mL (ref 0.35–4.50)

## 2013-10-25 LAB — BASIC METABOLIC PANEL
BUN: 20 mg/dL (ref 6–23)
CALCIUM: 9.2 mg/dL (ref 8.4–10.5)
CO2: 26 mEq/L (ref 19–32)
CREATININE: 1 mg/dL (ref 0.4–1.2)
Chloride: 105 mEq/L (ref 96–112)
GFR: 57.5 mL/min — ABNORMAL LOW (ref >60.00)
GLUCOSE: 100 mg/dL — AB (ref 70–99)
Potassium: 4.1 mEq/L (ref 3.5–5.1)
SODIUM: 139 meq/L (ref 135–145)

## 2013-11-01 ENCOUNTER — Encounter: Payer: BC Managed Care – PPO | Admitting: Internal Medicine

## 2013-11-07 ENCOUNTER — Encounter: Payer: Self-pay | Admitting: Internal Medicine

## 2013-11-07 ENCOUNTER — Ambulatory Visit (INDEPENDENT_AMBULATORY_CARE_PROVIDER_SITE_OTHER): Payer: BC Managed Care – PPO | Admitting: Internal Medicine

## 2013-11-07 VITALS — BP 102/70 | HR 71 | Temp 98.3°F | Resp 18 | Ht 66.25 in | Wt 169.0 lb

## 2013-11-07 DIAGNOSIS — Z0000 Encounter for general adult medical examination without abnormal findings: Secondary | ICD-10-CM

## 2013-11-07 NOTE — Progress Notes (Signed)
Subjective:    Patient ID: Stacy Sherman, female    DOB: 1950-07-14, 63 y.o.   MRN: [id number]  HPI 63 -year-old patient who is seen today for an annual health assessment.  She enjoys remarkably good health and is followed annually by gynecology. She has had a screening colonoscopy in 2004 and 2014 ([facility]). No real concerns or complaints. She takes no chronic medications. She is also followed by ophthalmology Bing Plume) due  to glaucoma which has been well-controlled.  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 She is a gravida 3 para 2 abortus 1 Surgical procedures have included a laparoscopy in 1988 as part of a infertility evaluation she had a D&C also 63 and normal childbirths  In 63 and 13   Social history. 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 Married 2 children nonsmoker  Family history father died  age 24. Mother died age 29 complications of diabetes and chronic kidney disease she was a hemodialysis patient. She remote history of breast cancer late onset as well as obesity and osteoarthritis 3 brothers 2 sisters one sister with abdominal aortic aneurysm as well as a cerebral aneurysm no other family history of cerebral aneurysms also positive for hypertension    Review of Systems  Constitutional: Negative for fever, appetite change, fatigue and unexpected weight change.  HENT: Negative for congestion, dental problem, ear pain, hearing loss, mouth sores, nosebleeds, sinus pressure, sore throat, tinnitus, trouble swallowing and voice change.   Eyes: Negative for photophobia, pain, redness and visual disturbance.  Respiratory: Negative for cough, chest tightness and shortness of breath.   Cardiovascular: Negative for chest pain, palpitations and leg swelling.  Gastrointestinal: Negative for nausea, vomiting, abdominal pain, diarrhea, constipation, blood in stool, abdominal distention and rectal pain.  Genitourinary: Negative for dysuria, urgency,  frequency, hematuria, flank pain, vaginal bleeding, vaginal discharge, difficulty urinating, genital sores, vaginal pain, menstrual problem and pelvic pain.  Musculoskeletal: Negative for arthralgias, back pain and neck stiffness.  Skin: Negative for rash.  Neurological: Negative for dizziness, syncope, speech difficulty, weakness, light-headedness, numbness and headaches.  Hematological: Negative for adenopathy. Does not bruise/bleed easily.  Psychiatric/Behavioral: Negative for suicidal ideas, behavioral problems, self-injury, dysphoric mood and agitation. The patient is not nervous/anxious.        Objective:   Physical Exam  Constitutional: She is oriented to person, place, and time. She appears well-developed and well-nourished.  HENT:  Head: Normocephalic and atraumatic.  Right Ear: External ear normal.  Left Ear: External ear normal.  Mouth/Throat: Oropharynx is clear and moist.  Eyes: Conjunctivae and EOM are normal.  Neck: Normal range of motion. Neck supple. No JVD present. No thyromegaly present.  Cardiovascular: Normal rate, regular rhythm, normal heart sounds and intact distal pulses.   No murmur heard. Pulmonary/Chest: Effort normal and breath sounds normal. She has no wheezes. She has no rales.  Abdominal: Soft. Bowel sounds are normal. She exhibits no distension and no mass. There is no tenderness. There is no rebound and no guarding.  Musculoskeletal: Normal range of motion. She exhibits no edema and no tenderness.  Neurological: She is alert and oriented to person, place, and time. She has normal reflexes. No cranial nerve deficit. She exhibits normal muscle tone. Coordination normal.  Skin: Skin is warm and dry. No rash noted.  Psychiatric: She has a normal mood and affect. Her behavior is normal.          Assessment & Plan:  Preventive health examination.  We'll  continue annual gynecologic followup.Regular exercise encouraged Return in one year or as  needed Laboratory update will be reviewed

## 2013-11-07 NOTE — Progress Notes (Signed)
Pre visit review using our clinic review tool, if applicable. No additional management support is needed unless otherwise documented below in the visit note. 

## 2013-11-07 NOTE — Patient Instructions (Signed)
Preventive Care for Adults A healthy lifestyle and preventive care can promote health and wellness. Preventive health guidelines for women include the following key practices.  A routine yearly physical is a good way to check with your health care provider about your health and preventive screening. It is a chance to share any concerns and updates on your health and to receive a thorough exam.  Visit your dentist for a routine exam and preventive care every 6 months. Brush your teeth twice a day and floss once a day. Good oral hygiene prevents tooth decay and gum disease.  The frequency of eye exams is based on your age, health, family medical history, use of contact lenses, and other factors. Follow your health care provider's recommendations for frequency of eye exams.  Eat a healthy diet. Foods like vegetables, fruits, whole grains, low-fat dairy products, and lean protein foods contain the nutrients you need without too many calories. Decrease your intake of foods high in solid fats, added sugars, and salt. Eat the right amount of calories for you.Get information about a proper diet from your health care provider, if necessary.  Regular physical exercise is one of the most important things you can do for your health. Most adults should get at least 150 minutes of moderate-intensity exercise (any activity that increases your heart rate and causes you to sweat) each week. In addition, most adults need muscle-strengthening exercises on 2 or more days a week.  Maintain a healthy weight. The body mass index (BMI) is a screening tool to identify possible weight problems. It provides an estimate of body fat based on height and weight. Your health care provider can find your BMI and can help you achieve or maintain a healthy weight.For adults 20 years and older:  A BMI below 18.5 is considered underweight.  A BMI of 18.5 to 24.9 is normal.  A BMI of 25 to 29.9 is considered overweight.  A BMI of  30 and above is considered obese.  Maintain normal blood lipids and cholesterol levels by exercising and minimizing your intake of saturated fat. Eat a balanced diet with plenty of fruit and vegetables. Blood tests for lipids and cholesterol should begin at age 76 and be repeated every 5 years. If your lipid or cholesterol levels are high, you are over 50, or you are at high risk for heart disease, you may need your cholesterol levels checked more frequently.Ongoing high lipid and cholesterol levels should be treated with medicines if diet and exercise are not working.  If you smoke, find out from your health care provider how to quit. If you do not use tobacco, do not start.  Lung cancer screening is recommended for adults aged 22-80 years who are at high risk for developing lung cancer because of a history of smoking. A yearly low-dose CT scan of the lungs is recommended for people who have at least a 30-pack-year history of smoking and are a current smoker or have quit within the past 15 years. A pack year of smoking is smoking an average of 1 pack of cigarettes a day for 1 year (for example: 1 pack a day for 30 years or 2 packs a day for 15 years). Yearly screening should continue until the smoker has stopped smoking for at least 15 years. Yearly screening should be stopped for people who develop a health problem that would prevent them from having lung cancer treatment.  If you are pregnant, do not drink alcohol. If you are breastfeeding,  be very cautious about drinking alcohol. If you are not pregnant and choose to drink alcohol, do not have more than 1 drink per day. One drink is considered to be 12 ounces (355 mL) of beer, 5 ounces (148 mL) of wine, or 1.5 ounces (44 mL) of liquor.  Avoid use of street drugs. Do not share needles with anyone. Ask for help if you need support or instructions about stopping the use of drugs.  High blood pressure causes heart disease and increases the risk of  stroke. Your blood pressure should be checked at least every 1 to 2 years. Ongoing high blood pressure should be treated with medicines if weight loss and exercise do not work.  If you are 3-86 years old, ask your health care provider if you should take aspirin to prevent strokes.  Diabetes screening involves taking a blood sample to check your fasting blood sugar level. This should be done once every 3 years, after age 67, if you are within normal weight and without risk factors for diabetes. Testing should be considered at a younger age or be carried out more frequently if you are overweight and have at least 1 risk factor for diabetes.  Breast cancer screening is essential preventive care for women. You should practice "breast self-awareness." This means understanding the normal appearance and feel of your breasts and may include breast self-examination. Any changes detected, no matter how small, should be reported to a health care provider. Women in their 8s and 30s should have a clinical breast exam (CBE) by a health care provider as part of a regular health exam every 1 to 3 years. After age 70, women should have a CBE every year. Starting at age 25, women should consider having a mammogram (breast X-ray test) every year. Women who have a family history of breast cancer should talk to their health care provider about genetic screening. Women at a high risk of breast cancer should talk to their health care providers about having an MRI and a mammogram every year.  Breast cancer gene (BRCA)-related cancer risk assessment is recommended for women who have family members with BRCA-related cancers. BRCA-related cancers include breast, ovarian, tubal, and peritoneal cancers. Having family members with these cancers may be associated with an increased risk for harmful changes (mutations) in the breast cancer genes BRCA1 and BRCA2. Results of the assessment will determine the need for genetic counseling and  BRCA1 and BRCA2 testing.  Routine pelvic exams to screen for cancer are no longer recommended for nonpregnant women who are considered low risk for cancer of the pelvic organs (ovaries, uterus, and vagina) and who do not have symptoms. Ask your health care provider if a screening pelvic exam is right for you.  If you have had past treatment for cervical cancer or a condition that could lead to cancer, you need Pap tests and screening for cancer for at least 20 years after your treatment. If Pap tests have been discontinued, your risk factors (such as having a new sexual partner) need to be reassessed to determine if screening should be resumed. Some women have medical problems that increase the chance of getting cervical cancer. In these cases, your health care provider may recommend more frequent screening and Pap tests.  The HPV test is an additional test that may be used for cervical cancer screening. The HPV test looks for the virus that can cause the cell changes on the cervix. The cells collected during the Pap test can be  tested for HPV. The HPV test could be used to screen women aged 30 years and older, and should be used in women of any age who have unclear Pap test results. After the age of 30, women should have HPV testing at the same frequency as a Pap test.  Colorectal cancer can be detected and often prevented. Most routine colorectal cancer screening begins at the age of 50 years and continues through age 75 years. However, your health care provider may recommend screening at an earlier age if you have risk factors for colon cancer. On a yearly basis, your health care provider may provide home test kits to check for hidden blood in the stool. Use of a small camera at the end of a tube, to directly examine the colon (sigmoidoscopy or colonoscopy), can detect the earliest forms of colorectal cancer. Talk to your health care provider about this at age 50, when routine screening begins. Direct  exam of the colon should be repeated every 5-10 years through age 75 years, unless early forms of pre-cancerous polyps or small growths are found.  People who are at an increased risk for hepatitis B should be screened for this virus. You are considered at high risk for hepatitis B if:  You were born in a country where hepatitis B occurs often. Talk with your health care provider about which countries are considered high risk.  Your parents were born in a high-risk country and you have not received a shot to protect against hepatitis B (hepatitis B vaccine).  You have HIV or AIDS.  You use needles to inject street drugs.  You live with, or have sex with, someone who has hepatitis B.  You get hemodialysis treatment.  You take certain medicines for conditions like cancer, organ transplantation, and autoimmune conditions.  Hepatitis C blood testing is recommended for all people born from 1945 through 1965 and any individual with known risks for hepatitis C.  Practice safe sex. Use condoms and avoid high-risk sexual practices to reduce the spread of sexually transmitted infections (STIs). STIs include gonorrhea, chlamydia, syphilis, trichomonas, herpes, HPV, and human immunodeficiency virus (HIV). Herpes, HIV, and HPV are viral illnesses that have no cure. They can result in disability, cancer, and death.  You should be screened for sexually transmitted illnesses (STIs) including gonorrhea and chlamydia if:  You are sexually active and are younger than 24 years.  You are older than 24 years and your health care provider tells you that you are at risk for this type of infection.  Your sexual activity has changed since you were last screened and you are at an increased risk for chlamydia or gonorrhea. Ask your health care provider if you are at risk.  If you are at risk of being infected with HIV, it is recommended that you take a prescription medicine daily to prevent HIV infection. This is  called preexposure prophylaxis (PrEP). You are considered at risk if:  You are a heterosexual woman, are sexually active, and are at increased risk for HIV infection.  You take drugs by injection.  You are sexually active with a partner who has HIV.  Talk with your health care provider about whether you are at high risk of being infected with HIV. If you choose to begin PrEP, you should first be tested for HIV. You should then be tested every 3 months for as long as you are taking PrEP.  Osteoporosis is a disease in which the bones lose minerals and strength   with aging. This can result in serious bone fractures or breaks. The risk of osteoporosis can be identified using a bone density scan. Women ages 65 years and over and women at risk for fractures or osteoporosis should discuss screening with their health care providers. Ask your health care provider whether you should take a calcium supplement or vitamin D to reduce the rate of osteoporosis.  Menopause can be associated with physical symptoms and risks. Hormone replacement therapy is available to decrease symptoms and risks. You should talk to your health care provider about whether hormone replacement therapy is right for you.  Use sunscreen. Apply sunscreen liberally and repeatedly throughout the day. You should seek shade when your shadow is shorter than you. Protect yourself by wearing long sleeves, pants, a wide-brimmed hat, and sunglasses year round, whenever you are outdoors.  Once a month, do a whole body skin exam, using a mirror to look at the skin on your back. Tell your health care provider of new moles, moles that have irregular borders, moles that are larger than a pencil eraser, or moles that have changed in shape or color.  Stay current with required vaccines (immunizations).  Influenza vaccine. All adults should be immunized every year.  Tetanus, diphtheria, and acellular pertussis (Td, Tdap) vaccine. Pregnant women should  receive 1 dose of Tdap vaccine during each pregnancy. The dose should be obtained regardless of the length of time since the last dose. Immunization is preferred during the 27th-36th week of gestation. An adult who has not previously received Tdap or who does not know her vaccine status should receive 1 dose of Tdap. This initial dose should be followed by tetanus and diphtheria toxoids (Td) booster doses every 10 years. Adults with an unknown or incomplete history of completing a 3-dose immunization series with Td-containing vaccines should begin or complete a primary immunization series including a Tdap dose. Adults should receive a Td booster every 10 years.  Varicella vaccine. An adult without evidence of immunity to varicella should receive 2 doses or a second dose if she has previously received 1 dose. Pregnant females who do not have evidence of immunity should receive the first dose after pregnancy. This first dose should be obtained before leaving the health care facility. The second dose should be obtained 4-8 weeks after the first dose.  Human papillomavirus (HPV) vaccine. Females aged 13-26 years who have not received the vaccine previously should obtain the 3-dose series. The vaccine is not recommended for use in pregnant females. However, pregnancy testing is not needed before receiving a dose. If a female is found to be pregnant after receiving a dose, no treatment is needed. In that case, the remaining doses should be delayed until after the pregnancy. Immunization is recommended for any person with an immunocompromised condition through the age of 26 years if she did not get any or all doses earlier. During the 3-dose series, the second dose should be obtained 4-8 weeks after the first dose. The third dose should be obtained 24 weeks after the first dose and 16 weeks after the second dose.  Zoster vaccine. One dose is recommended for adults aged 60 years or older unless certain conditions are  present.  Measles, mumps, and rubella (MMR) vaccine. Adults born before 1957 generally are considered immune to measles and mumps. Adults born in 1957 or later should have 1 or more doses of MMR vaccine unless there is a contraindication to the vaccine or there is laboratory evidence of immunity to   each of the three diseases. A routine second dose of MMR vaccine should be obtained at least 28 days after the first dose for students attending postsecondary schools, health care workers, or international travelers. People who received inactivated measles vaccine or an unknown type of measles vaccine during 1963-1967 should receive 2 doses of MMR vaccine. People who received inactivated mumps vaccine or an unknown type of mumps vaccine before 1979 and are at high risk for mumps infection should consider immunization with 2 doses of MMR vaccine. For females of childbearing age, rubella immunity should be determined. If there is no evidence of immunity, females who are not pregnant should be vaccinated. If there is no evidence of immunity, females who are pregnant should delay immunization until after pregnancy. Unvaccinated health care workers born before 1957 who lack laboratory evidence of measles, mumps, or rubella immunity or laboratory confirmation of disease should consider measles and mumps immunization with 2 doses of MMR vaccine or rubella immunization with 1 dose of MMR vaccine.  Pneumococcal 13-valent conjugate (PCV13) vaccine. When indicated, a person who is uncertain of her immunization history and has no record of immunization should receive the PCV13 vaccine. An adult aged 19 years or older who has certain medical conditions and has not been previously immunized should receive 1 dose of PCV13 vaccine. This PCV13 should be followed with a dose of pneumococcal polysaccharide (PPSV23) vaccine. The PPSV23 vaccine dose should be obtained at least 8 weeks after the dose of PCV13 vaccine. An adult aged 19  years or older who has certain medical conditions and previously received 1 or more doses of PPSV23 vaccine should receive 1 dose of PCV13. The PCV13 vaccine dose should be obtained 1 or more years after the last PPSV23 vaccine dose.  Pneumococcal polysaccharide (PPSV23) vaccine. When PCV13 is also indicated, PCV13 should be obtained first. All adults aged 65 years and older should be immunized. An adult younger than age 65 years who has certain medical conditions should be immunized. Any person who resides in a nursing home or long-term care facility should be immunized. An adult smoker should be immunized. People with an immunocompromised condition and certain other conditions should receive both PCV13 and PPSV23 vaccines. People with human immunodeficiency virus (HIV) infection should be immunized as soon as possible after diagnosis. Immunization during chemotherapy or radiation therapy should be avoided. Routine use of PPSV23 vaccine is not recommended for American Indians, [place] Natives, or people younger than 65 years unless there are medical conditions that require PPSV23 vaccine. When indicated, people who have unknown immunization and have no record of immunization should receive PPSV23 vaccine. One-time revaccination 5 years after the first dose of PPSV23 is recommended for people aged 19-64 years who have chronic kidney failure, nephrotic syndrome, asplenia, or immunocompromised conditions. People who received 1-2 doses of PPSV23 before age 65 years should receive another dose of PPSV23 vaccine at age 65 years or later if at least 5 years have passed since the previous dose. Doses of PPSV23 are not needed for people immunized with PPSV23 at or after age 65 years.  Meningococcal vaccine. Adults with asplenia or persistent complement component deficiencies should receive 2 doses of quadrivalent meningococcal conjugate (MenACWY-D) vaccine. The doses should be obtained at least 2 months apart.  Microbiologists working with certain meningococcal bacteria, military recruits, people at risk during an outbreak, and people who travel to or live in countries with a high rate of meningitis should be immunized. A first-year college student up through age   21 years who is living in a residence hall should receive a dose if she did not receive a dose on or after her 16th birthday. Adults who have certain high-risk conditions should receive one or more doses of vaccine.  Hepatitis A vaccine. Adults who wish to be protected from this disease, have certain high-risk conditions, work with hepatitis A-infected animals, work in hepatitis A research labs, or travel to or work in countries with a high rate of hepatitis A should be immunized. Adults who were previously unvaccinated and who anticipate close contact with an international adoptee during the first 60 days after arrival in the [place] from a country with a high rate of hepatitis A should be immunized.  Hepatitis B vaccine. Adults who wish to be protected from this disease, have certain high-risk conditions, may be exposed to blood or other infectious body fluids, are household contacts or sex partners of hepatitis B positive people, are clients or workers in certain care facilities, or travel to or work in countries with a high rate of hepatitis B should be immunized.  Haemophilus influenzae type b (Hib) vaccine. A previously unvaccinated person with asplenia or sickle cell disease or having a scheduled splenectomy should receive 1 dose of Hib vaccine. Regardless of previous immunization, a recipient of a hematopoietic stem cell transplant should receive a 3-dose series 6-12 months after her successful transplant. Hib vaccine is not recommended for adults with HIV infection. Preventive Services / Frequency Ages 64 to 68 years  Blood pressure check.** / Every 1 to 2 years.  Lipid and cholesterol check.** / Every 5 years beginning at age  22.  Clinical breast exam.** / Every 3 years for women in their 88s and 53s.  BRCA-related cancer risk assessment.** / For women who have family members with a BRCA-related cancer (breast, ovarian, tubal, or peritoneal cancers).  Pap test.** / Every 2 years from ages 90 through 51. Every 3 years starting at age 21 through age 56 or 3 with a history of 3 consecutive normal Pap tests.  HPV screening.** / Every 3 years from ages 24 through ages 1 to 46 with a history of 3 consecutive normal Pap tests.  Hepatitis C blood test.** / For any individual with known risks for hepatitis C.  Skin self-exam. / Monthly.  Influenza vaccine. / Every year.  Tetanus, diphtheria, and acellular pertussis (Tdap, Td) vaccine.** / Consult your health care provider. Pregnant women should receive 1 dose of Tdap vaccine during each pregnancy. 1 dose of Td every 10 years.  Varicella vaccine.** / Consult your health care provider. Pregnant females who do not have evidence of immunity should receive the first dose after pregnancy.  HPV vaccine. / 3 doses over 6 months, if 72 and younger. The vaccine is not recommended for use in pregnant females. However, pregnancy testing is not needed before receiving a dose.  Measles, mumps, rubella (MMR) vaccine.** / You need at least 1 dose of MMR if you were born in 1957 or later. You may also need a 2nd dose. For females of childbearing age, rubella immunity should be determined. If there is no evidence of immunity, females who are not pregnant should be vaccinated. If there is no evidence of immunity, females who are pregnant should delay immunization until after pregnancy.  Pneumococcal 13-valent conjugate (PCV13) vaccine.** / Consult your health care provider.  Pneumococcal polysaccharide (PPSV23) vaccine.** / 1 to 2 doses if you smoke cigarettes or if you have certain conditions.  Meningococcal vaccine.** /  1 dose if you are age 19 to 21 years and a first-year college  student living in a residence hall, or have one of several medical conditions, you need to get vaccinated against meningococcal disease. You may also need additional booster doses.  Hepatitis A vaccine.** / Consult your health care provider.  Hepatitis B vaccine.** / Consult your health care provider.  Haemophilus influenzae type b (Hib) vaccine.** / Consult your health care provider. Ages 40 to 64 years  Blood pressure check.** / Every 1 to 2 years.  Lipid and cholesterol check.** / Every 5 years beginning at age 20 years.  Lung cancer screening. / Every year if you are aged 55-80 years and have a 30-pack-year history of smoking and currently smoke or have quit within the past 15 years. Yearly screening is stopped once you have quit smoking for at least 15 years or develop a health problem that would prevent you from having lung cancer treatment.  Clinical breast exam.** / Every year after age 40 years.  BRCA-related cancer risk assessment.** / For women who have family members with a BRCA-related cancer (breast, ovarian, tubal, or peritoneal cancers).  Mammogram.** / Every year beginning at age 40 years and continuing for as long as you are in good health. Consult with your health care provider.  Pap test.** / Every 3 years starting at age 30 years through age 65 or 70 years with a history of 3 consecutive normal Pap tests.  HPV screening.** / Every 3 years from ages 30 years through ages 65 to 70 years with a history of 3 consecutive normal Pap tests.  Fecal occult blood test (FOBT) of stool. / Every year beginning at age 50 years and continuing until age 75 years. You may not need to do this test if you get a colonoscopy every 10 years.  Flexible sigmoidoscopy or colonoscopy.** / Every 5 years for a flexible sigmoidoscopy or every 10 years for a colonoscopy beginning at age 50 years and continuing until age 75 years.  Hepatitis C blood test.** / For all people born from 1945 through  1965 and any individual with known risks for hepatitis C.  Skin self-exam. / Monthly.  Influenza vaccine. / Every year.  Tetanus, diphtheria, and acellular pertussis (Tdap/Td) vaccine.** / Consult your health care provider. Pregnant women should receive 1 dose of Tdap vaccine during each pregnancy. 1 dose of Td every 10 years.  Varicella vaccine.** / Consult your health care provider. Pregnant females who do not have evidence of immunity should receive the first dose after pregnancy.  Zoster vaccine.** / 1 dose for adults aged 60 years or older.  Measles, mumps, rubella (MMR) vaccine.** / You need at least 1 dose of MMR if you were born in 1957 or later. You may also need a 2nd dose. For females of childbearing age, rubella immunity should be determined. If there is no evidence of immunity, females who are not pregnant should be vaccinated. If there is no evidence of immunity, females who are pregnant should delay immunization until after pregnancy.  Pneumococcal 13-valent conjugate (PCV13) vaccine.** / Consult your health care provider.  Pneumococcal polysaccharide (PPSV23) vaccine.** / 1 to 2 doses if you smoke cigarettes or if you have certain conditions.  Meningococcal vaccine.** / Consult your health care provider.  Hepatitis A vaccine.** / Consult your health care provider.  Hepatitis B vaccine.** / Consult your health care provider.  Haemophilus influenzae type b (Hib) vaccine.** / Consult your health care provider. Ages 65   years and over  Blood pressure check.** / Every 1 to 2 years.  Lipid and cholesterol check.** / Every 5 years beginning at age 79 years.  Lung cancer screening. / Every year if you are aged 26-80 years and have a 30-pack-year history of smoking and currently smoke or have quit within the past 15 years. Yearly screening is stopped once you have quit smoking for at least 15 years or develop a health problem that would prevent you from having lung cancer  treatment.  Clinical breast exam.** / Every year after age 63 years.  BRCA-related cancer risk assessment.** / For women who have family members with a BRCA-related cancer (breast, ovarian, tubal, or peritoneal cancers).  Mammogram.** / Every year beginning at age 50 years and continuing for as long as you are in good health. Consult with your health care provider.  Pap test.** / Every 3 years starting at age 40 years through age 92 or 39 years with 3 consecutive normal Pap tests. Testing can be stopped between 65 and 70 years with 3 consecutive normal Pap tests and no abnormal Pap or HPV tests in the past 10 years.  HPV screening.** / Every 3 years from ages 52 years through ages 64 or 35 years with a history of 3 consecutive normal Pap tests. Testing can be stopped between 65 and 70 years with 3 consecutive normal Pap tests and no abnormal Pap or HPV tests in the past 10 years.  Fecal occult blood test (FOBT) of stool. / Every year beginning at age 14 years and continuing until age 25 years. You may not need to do this test if you get a colonoscopy every 10 years.  Flexible sigmoidoscopy or colonoscopy.** / Every 5 years for a flexible sigmoidoscopy or every 10 years for a colonoscopy beginning at age 19 years and continuing until age 46 years.  Hepatitis C blood test.** / For all people born from [birth year] through 1965 and any individual with known risks for hepatitis C.  Osteoporosis screening.** / A one-time screening for women ages 75 years and over and women at risk for fractures or osteoporosis.  Skin self-exam. / Monthly.  Influenza vaccine. / Every year.  Tetanus, diphtheria, and acellular pertussis (Tdap/Td) vaccine.** / 1 dose of Td every 10 years.  Varicella vaccine.** / Consult your health care provider.  Zoster vaccine.** / 1 dose for adults aged 76 years or older.  Pneumococcal 13-valent conjugate (PCV13) vaccine.** / Consult your health care provider.  Pneumococcal  polysaccharide (PPSV23) vaccine.** / 1 dose for all adults aged 48 years and older.  Meningococcal vaccine.** / Consult your health care provider.  Hepatitis A vaccine.** / Consult your health care provider.  Hepatitis B vaccine.** / Consult your health care provider.  Haemophilus influenzae type b (Hib) vaccine.** / Consult your health care provider. ** Family history and personal history of risk and conditions may change your health care provider's recommendations. Document Released: 04/06/2001 Document Revised: 06/25/2013 Document Reviewed: 07/06/2010 [facility] Patient Information 2015 [place], [place]. This information is not intended to replace advice given to you by your health care provider. Make sure you discuss any questions you have with your health care provider. Cardiac Diet This diet can help prevent heart disease and stroke. Many factors influence your heart health, including eating and exercise habits. Coronary risk rises a lot with abnormal blood fat (lipid) levels. Cardiac meal planning includes limiting unhealthy fats, increasing healthy fats, and making other small dietary changes. General guidelines are as follows:  Adjust calorie intake to reach and maintain desirable body weight.  Limit total fat intake to less than 30% of total calories. Saturated fat should be less than 7% of calories.  Saturated fats are found in animal products and in some vegetable products. Saturated vegetable fats are found in coconut oil, cocoa butter, palm oil, and palm kernel oil. Read labels carefully to avoid these products as much as possible. Use butter in moderation. Choose tub margarines and oils that have 2 grams of fat or less. Good cooking oils are canola and olive oils.  Practice low-fat cooking techniques. Do not fry food. Instead, broil, bake, boil, steam, grill, roast on a rack, stir-fry, or microwave it. Other fat reducing suggestions include:  Remove the skin from poultry.  Remove  all visible fat from meats.  Skim the fat off stews, soups, and gravies before serving them.  Steam vegetables in water or broth instead of sauting them in fat.  Avoid foods with trans fat (or hydrogenated oils), such as commercially fried foods and commercially baked goods. Commercial shortening and deep-frying fats will contain trans fat.  Increase intake of fruits, vegetables, whole grains, and legumes to replace foods high in fat.  Increase consumption of nuts, legumes, and seeds to at least 4 servings weekly. One serving of a legume equals  cup, and 1 serving of nuts or seeds equals  cup.  Choose whole grains more often. Have 3 servings per day (a serving is 1 ounce [oz]).  Eat 4 to 5 servings of vegetables per day. A serving of vegetables is 1 cup of raw leafy vegetables;  cup of raw or cooked cut-up vegetables;  cup of vegetable juice.  Eat 4 to 5 servings of fruit per day. A serving of fruit is 1 medium whole fruit;  cup of dried fruit;  cup of fresh, frozen, or canned fruit;  cup of 100% fruit juice.  Increase your intake of dietary fiber to 20 to 30 grams per day. Insoluble fiber may help lower your risk of heart disease and may help curb your appetite.  Soluble fiber binds cholesterol to be removed from the blood. Foods high in soluble fiber are dried beans, citrus fruits, oats, apples, bananas, broccoli, Brussels sprouts, and eggplant.  Try to include foods fortified with plant sterols or stanols, such as yogurt, breads, juices, or margarines. Choose several fortified foods to achieve a daily intake of 2 to 3 grams of plant sterols or stanols.  Foods with omega-3 fats can help reduce your risk of heart disease. Aim to have a 3.5 oz portion of fatty fish twice per week, such as salmon, mackerel, albacore tuna, sardines, lake trout, or herring. If you wish to take a fish oil supplement, choose one that contains 1 gram of both DHA and EPA.  Limit processed meats to 2  servings (3 oz portion) weekly.  Limit the sodium in your diet to 1500 milligrams (mg) per day. If you have high blood pressure, talk to a registered dietitian about a DASH (Dietary Approaches to Stop Hypertension) eating plan.  Limit sweets and beverages with added sugar, such as soda, to no more than 5 servings per week. One serving is:   1 tablespoon sugar.  1 tablespoon jelly or jam.   cup sorbet.  1 cup lemonade.   cup regular soda. CHOOSING FOODS Starches  Allowed: Breads: All kinds (wheat, rye, raisin, white, oatmeal, [place], [place], and English muffin bread). Low-fat rolls: English muffins, frankfurter and hamburger buns, bagels,  pita bread, tortillas (not fried). Pancakes, waffles, biscuits, and muffins made with recommended oil.  Avoid: Products made with saturated or trans fats, oils, or whole milk products. Butter rolls, cheese breads, croissants. Commercial doughnuts, muffins, sweet rolls, biscuits, waffles, pancakes, store-bought mixes. Crackers  Allowed: Low-fat crackers and snacks: Animal, graham, rye, saltine (with recommended oil, no lard), oyster, and matzo crackers. Bread sticks, melba toast, rusks, flatbread, pretzels, and light popcorn.  Avoid: High-fat crackers: cheese crackers, butter crackers, and those made with coconut, palm oil, or trans fat (hydrogenated oils). Buttered popcorn. Cereals  Allowed: Hot or cold whole-grain cereals.  Avoid: Cereals containing coconut, hydrogenated vegetable fat, or animal fat. Potatoes / Pasta / Rice  Allowed: All kinds of potatoes, rice, and pasta (such as macaroni, spaghetti, and noodles).  Avoid: Pasta or rice prepared with cream sauce or high-fat cheese. Chow mein noodles, [place] fries. Vegetables  Allowed: All vegetables and vegetable juices.  Avoid: Fried vegetables. Vegetables in cream, butter, or high-fat cheese sauces. Limit coconut. Fruit in cream or custard. Protein  Allowed: Limit your intake of  meat, seafood, and poultry to no more than 6 oz (cooked weight) per day. All lean, well-trimmed beef, veal, pork, and lamb. All chicken and [place] without skin. All fish and shellfish. Wild game: wild duck, rabbit, pheasant, and venison. Egg whites or low-cholesterol egg substitutes may be used as desired. Meatless dishes: recipes with dried beans, peas, lentils, and tofu (soybean curd). Seeds and nuts: all seeds and most nuts.  Avoid: Prime grade and other heavily marbled and fatty meats, such as short ribs, spare ribs, rib eye roast or steak, frankfurters, sausage, bacon, and high-fat luncheon meats, mutton. Caviar. Commercially fried fish. Domestic duck, goose, venison sausage. Organ meats: liver, gizzard, heart, chitterlings, brains, kidney, sweetbreads. Dairy  Allowed: Low-fat cheeses: nonfat or low-fat cottage cheese (1% or 2% fat), cheeses made with part skim milk, such as mozzarella, farmers, string, or ricotta. (Cheeses should be labeled no more than 2 to 6 grams fat per oz.). Skim (or 1%) milk: liquid, powdered, or evaporated. Buttermilk made with low-fat milk. Drinks made with skim or low-fat milk or cocoa. Chocolate milk or cocoa made with skim or low-fat (1%) milk. Nonfat or low-fat yogurt.  Avoid: Whole milk cheeses, including colby, cheddar, [place], Monterey Jack, [place], [place], [place], American, Swiss, and blue. Creamed cottage cheese, cream cheese. Whole milk and whole milk products, including buttermilk or yogurt made from whole milk, drinks made from whole milk. Condensed milk, evaporated whole milk, and 2% milk. Soups and Combination Foods  Allowed: Low-fat low-sodium soups: broth, dehydrated soups, homemade broth, soups with the fat removed, homemade cream soups made with skim or low-fat milk. Low-fat spaghetti, lasagna, chili, and Spanish rice if low-fat ingredients and low-fat cooking techniques are used.  Avoid: Cream soups made with whole milk, cream, or high-fat cheese. All  other soups. Desserts and Sweets  Allowed: Sherbet, fruit ices, gelatins, meringues, and angel food cake. Homemade desserts with recommended fats, oils, and milk products. Jam, jelly, honey, marmalade, sugars, and syrups. Pure sugar candy, such as gum drops, hard candy, jelly beans, marshmallows, mints, and small amounts of dark chocolate.  Avoid: Commercially prepared cakes, pies, cookies, frosting, pudding, or mixes for these products. Desserts containing whole milk products, chocolate, coconut, lard, palm oil, or palm kernel oil. Ice cream or ice cream drinks. Candy that contains chocolate, coconut, butter, hydrogenated fat, or unknown ingredients. Buttered syrups. Fats and Oils  Allowed: Vegetable oils: safflower, sunflower, corn, soybean,  cottonseed, sesame, canola, olive, or peanut. Non-hydrogenated margarines. Salad dressing or mayonnaise: homemade or commercial, made with a recommended oil. Low or nonfat salad dressing or mayonnaise.  Limit added fats and oils to 6 to 8 tsp per day (includes fats used in cooking, baking, salads, and spreads on bread). Remember to count the "hidden fats" in foods.  Avoid: Solid fats and shortenings: butter, lard, salt pork, bacon drippings. Gravy containing meat fat, shortening, or suet. Cocoa butter, coconut. Coconut oil, palm oil, palm kernel oil, or hydrogenated oils: these ingredients are often used in bakery products, nondairy creamers, whipped toppings, candy, and commercially fried foods. Read labels carefully. Salad dressings made of unknown oils, sour cream, or cheese, such as blue cheese and Roquefort. Cream, all kinds: half-and-half, light, heavy, or whipping. Sour cream or cream cheese (even if "light" or low-fat). Nondairy cream substitutes: coffee creamers and sour cream substitutes made with palm, palm kernel, hydrogenated oils, or coconut oil. Beverages  Allowed: Coffee (regular or decaffeinated), tea. Diet carbonated beverages, mineral water.  Alcohol: Check with your caregiver. Moderation is recommended.  Avoid: Whole milk, regular sodas, and juice drinks with added sugar. Condiments  Allowed: All seasonings and condiments. Cocoa powder. "Cream" sauces made with recommended ingredients.  Avoid: Carob powder made with hydrogenated fats. SAMPLE MENU Breakfast   cup orange juice   cup oatmeal  1 slice toast  1 tsp margarine  1 cup skim milk Lunch  [place] sandwich with 2 oz [place], 2 slices bread  Lettuce and tomato slices  Fresh fruit  Carrot sticks  Coffee or tea Snack  Fresh fruit or low-fat crackers Dinner  3 oz lean ground beef  1 baked potato  1 tsp margarine   cup asparagus  Lettuce salad  1 tbs non-creamy dressing   cup peach slices  1 cup skim milk Document Released: 11/18/2007 Document Revised: 08/10/2011 Document Reviewed: 04/10/2013 [employer] Patient Information 2015 [place], [place]. This information is not intended to replace advice given to you by your health care provider. Make sure you discuss any questions you have with your health care provider.

## 2013-11-27 ENCOUNTER — Other Ambulatory Visit (HOSPITAL_COMMUNITY)
Admission: RE | Admit: 2013-11-27 | Discharge: 2013-11-27 | Disposition: A | Discharge status: Home / Self Care | Payer: BC Managed Care – PPO | Source: Ambulatory Visit | Attending: Obstetrics and Gynecology | Admitting: Obstetrics and Gynecology

## 2013-11-27 ENCOUNTER — Other Ambulatory Visit: Payer: Self-pay | Admitting: Obstetrics and Gynecology

## 2013-11-27 DIAGNOSIS — Z01419 Encounter for gynecological examination (general) (routine) without abnormal findings: Secondary | ICD-10-CM | POA: Diagnosis present

## 2013-11-28 LAB — CYTOLOGY - PAP

## 2014-05-09 ENCOUNTER — Other Ambulatory Visit: Payer: Self-pay | Admitting: Internal Medicine

## 2014-09-02 ENCOUNTER — Other Ambulatory Visit: Payer: Self-pay

## 2014-09-02 DIAGNOSIS — Z1231 Encounter for screening mammogram for malignant neoplasm of breast: Secondary | ICD-10-CM

## 2014-09-19 ENCOUNTER — Other Ambulatory Visit: Payer: Self-pay | Admitting: Internal Medicine

## 2014-10-10 ENCOUNTER — Ambulatory Visit
Admission: RE | Admit: 2014-10-10 | Discharge: 2014-10-10 | Disposition: A | Discharge status: Home / Self Care | Payer: BLUE CROSS/BLUE SHIELD | Source: Ambulatory Visit

## 2014-10-10 DIAGNOSIS — Z1231 Encounter for screening mammogram for malignant neoplasm of breast: Secondary | ICD-10-CM

## 2014-10-11 ENCOUNTER — Ambulatory Visit: Payer: Self-pay

## 2014-11-08 ENCOUNTER — Other Ambulatory Visit (INDEPENDENT_AMBULATORY_CARE_PROVIDER_SITE_OTHER): Payer: BLUE CROSS/BLUE SHIELD

## 2014-11-08 DIAGNOSIS — Z0000 Encounter for general adult medical examination without abnormal findings: Secondary | ICD-10-CM | POA: Diagnosis not present

## 2014-11-08 LAB — CBC WITH DIFFERENTIAL/PLATELET
BASOS PCT: 1 % (ref 0.0–3.0)
Basophils Absolute: 0.1 10*3/uL (ref 0.0–0.1)
EOS ABS: 0.1 10*3/uL (ref 0.0–0.7)
EOS PCT: 1.2 % (ref 0.0–5.0)
HCT: 41.6 % (ref 36.0–46.0)
Hemoglobin: 14.1 g/dL (ref 12.0–15.0)
Lymphocytes Relative: 47.5 % — ABNORMAL HIGH (ref 12.0–46.0)
Lymphs Abs: 2.4 10*3/uL (ref 0.7–4.0)
MCHC: 33.9 g/dL (ref 30.0–36.0)
MCV: 96.1 fl (ref 78.0–100.0)
MONO ABS: 0.3 10*3/uL (ref 0.1–1.0)
Monocytes Relative: 6.4 % (ref 3.0–12.0)
NEUTROS ABS: 2.2 10*3/uL (ref 1.4–7.7)
Neutrophils Relative %: 43.9 % (ref 43.0–77.0)
PLATELETS: 234 10*3/uL (ref 150.0–400.0)
RBC: 4.33 Mil/uL (ref 3.87–5.11)
RDW: 13.5 % (ref 11.5–15.5)
WBC: 5.1 10*3/uL (ref 4.0–10.5)

## 2014-11-08 LAB — HEPATIC FUNCTION PANEL
ALT: 16 U/L (ref 0–35)
AST: 19 U/L (ref 0–37)
Albumin: 4.2 g/dL (ref 3.5–5.2)
Alkaline Phosphatase: 53 U/L (ref 39–117)
BILIRUBIN TOTAL: 0.5 mg/dL (ref 0.2–1.2)
Bilirubin, Direct: 0.1 mg/dL (ref 0.0–0.3)
TOTAL PROTEIN: 7.2 g/dL (ref 6.0–8.3)

## 2014-11-08 LAB — POCT URINALYSIS DIPSTICK
Bilirubin, UA: NEGATIVE
Glucose, UA: NEGATIVE
KETONES UA: NEGATIVE
LEUKOCYTES UA: NEGATIVE
Nitrite, UA: NEGATIVE
PH UA: 7.5
PROTEIN UA: NEGATIVE
SPEC GRAV UA: 1.02
UROBILINOGEN UA: 0.2

## 2014-11-08 LAB — LIPID PANEL
Cholesterol: 198 mg/dL (ref 0–200)
HDL: 61.9 mg/dL (ref >39.00)
LDL Cholesterol: 115 mg/dL — ABNORMAL HIGH (ref 0–99)
NONHDL: 136.08
Total CHOL/HDL Ratio: 3
Triglycerides: 104 mg/dL (ref 0.0–149.0)
VLDL: 20.8 mg/dL (ref 0.0–40.0)

## 2014-11-08 LAB — TSH: TSH: 2.5 u[IU]/mL (ref 0.35–4.50)

## 2014-11-08 LAB — BASIC METABOLIC PANEL
BUN: 22 mg/dL (ref 6–23)
CO2: 29 meq/L (ref 19–32)
CREATININE: 0.9 mg/dL (ref 0.40–1.20)
Calcium: 9.7 mg/dL (ref 8.4–10.5)
Chloride: 105 mEq/L (ref 96–112)
GFR: 66.96 mL/min (ref >60.00)
GLUCOSE: 99 mg/dL (ref 70–99)
Potassium: 5.1 mEq/L (ref 3.5–5.1)
Sodium: 141 mEq/L (ref 135–145)

## 2014-11-26 ENCOUNTER — Encounter: Payer: Self-pay | Admitting: Internal Medicine

## 2014-11-26 ENCOUNTER — Ambulatory Visit (INDEPENDENT_AMBULATORY_CARE_PROVIDER_SITE_OTHER): Payer: BLUE CROSS/BLUE SHIELD | Admitting: Internal Medicine

## 2014-11-26 VITALS — BP 102/70 | HR 64 | Temp 98.2°F | Resp 20 | Ht 65.5 in | Wt 170.0 lb

## 2014-11-26 DIAGNOSIS — Z0000 Encounter for general adult medical examination without abnormal findings: Secondary | ICD-10-CM | POA: Diagnosis not present

## 2014-11-26 DIAGNOSIS — Z23 Encounter for immunization: Secondary | ICD-10-CM | POA: Diagnosis not present

## 2014-11-26 MED ORDER — MOMETASONE FUROATE 50 MCG/ACT NA SUSP: NASAL | Status: DC

## 2014-11-26 NOTE — Progress Notes (Signed)
Pre visit review using our clinic review tool, if applicable. No additional management support is needed unless otherwise documented below in the visit note. 

## 2014-11-26 NOTE — Patient Instructions (Signed)
It is important that you exercise regularly, at least 20 minutes 3 to 4 times per week.  If you develop chest pain or shortness of breath seek  medical attention.  Health Maintenance Adopting a healthy lifestyle and getting preventive care can go a long way to promote health and wellness. Talk with your health care provider about what schedule of regular examinations is right for you. This is a good chance for you to check in with your provider about disease prevention and staying healthy. In between checkups, there are plenty of things you can do on your own. Experts have done a lot of research about which lifestyle changes and preventive measures are most likely to keep you healthy. Ask your health care provider for more information. WEIGHT AND DIET  Eat a healthy diet  Be sure to include plenty of vegetables, fruits, low-fat dairy products, and lean protein.  Do not eat a lot of foods high in solid fats, added sugars, or salt.  Get regular exercise. This is one of the most important things you can do for your health.  Most adults should exercise for at least 150 minutes each week. The exercise should increase your heart rate and make you sweat (moderate-intensity exercise).  Most adults should also do strengthening exercises at least twice a week. This is in addition to the moderate-intensity exercise.  Maintain a healthy weight  Body mass index (BMI) is a measurement that can be used to identify possible weight problems. It estimates body fat based on height and weight. Your health care provider can help determine your BMI and help you achieve or maintain a healthy weight.  For females 64 years of age and older:   A BMI below 18.5 is considered underweight.  A BMI of 18.5 to 24.9 is normal.  A BMI of 25 to 29.9 is considered overweight.  A BMI of 30 and above is considered obese.  Watch levels of cholesterol and blood lipids  You should start having your blood tested for  lipids and cholesterol at 64 years of age, then have this test every 5 years.  You may need to have your cholesterol levels checked more often if:  Your lipid or cholesterol levels are high.  You are older than 64 years of age.  You are at high risk for heart disease.  CANCER SCREENING   Lung Cancer  Lung cancer screening is recommended for adults 64-5 years old who are at high risk for lung cancer because of a history of smoking.  A yearly low-dose CT scan of the lungs is recommended for people who:  Currently smoke.  Have quit within the past 15 years.  Have at least a 30-pack-year history of smoking. A pack year is smoking an average of one pack of cigarettes a day for 1 year.  Yearly screening should continue until it has been 15 years since you quit.  Yearly screening should stop if you develop a health problem that would prevent you from having lung cancer treatment.  Breast Cancer  Practice breast self-awareness. This means understanding how your breasts normally appear and feel.  It also means doing regular breast self-exams. Let your health care provider know about any changes, no matter how small.  If you are in your 64s or 30s, you should have a clinical breast exam (CBE) by a health care provider every 1-3 years as part of a regular health exam.  If you are 64 or older, have a CBE every year.  Also consider having a breast X-ray (mammogram) every year.  If you have a family history of breast cancer, talk to your health care provider about genetic screening.  If you are at high risk for breast cancer, talk to your health care provider about having an MRI and a mammogram every year.  Breast cancer gene (BRCA) assessment is recommended for women who have family members with BRCA-related cancers. BRCA-related cancers include:  Breast.  Ovarian.  Tubal.  Peritoneal cancers.  Results of the assessment will determine the need for genetic counseling and BRCA1  and BRCA2 testing. Cervical Cancer Routine pelvic examinations to screen for cervical cancer are no longer recommended for nonpregnant women who are considered low risk for cancer of the pelvic organs (ovaries, uterus, and vagina) and who do not have symptoms. A pelvic examination may be necessary if you have symptoms including those associated with pelvic infections. Ask your health care provider if a screening pelvic exam is right for you.   The Pap test is the screening test for cervical cancer for women who are considered at risk.  If you had a hysterectomy for a problem that was not cancer or a condition that could lead to cancer, then you no longer need Pap tests.  If you are older than 64 years, and you have had normal Pap tests for the past 10 years, you no longer need to have Pap tests.  If you have had past treatment for cervical cancer or a condition that could lead to cancer, you need Pap tests and screening for cancer for at least 20 years after your treatment.  If you no longer get a Pap test, assess your risk factors if they change (such as having a new sexual partner). This can affect whether you should start being screened again.  Some women have medical problems that increase their chance of getting cervical cancer. If this is the case for you, your health care provider may recommend more frequent screening and Pap tests.  The human papillomavirus (HPV) test is another test that may be used for cervical cancer screening. The HPV test looks for the virus that can cause cell changes in the cervix. The cells collected during the Pap test can be tested for HPV.  The HPV test can be used to screen women 64 years of age and older. Getting tested for HPV can extend the interval between normal Pap tests from three to five years.  An HPV test also should be used to screen women of any age who have unclear Pap test results.  After 64 years of age, women should have HPV testing as often  as Pap tests.  Colorectal Cancer  This type of cancer can be detected and often prevented.  Routine colorectal cancer screening usually begins at 64 years of age and continues through 64 years of age  Your health care provider may recommend screening at an earlier age if you have risk factors for colon cancer.  Your health care provider may also recommend using home test kits to check for hidden blood in the stool.  A small camera at the end of a tube can be used to examine your colon directly (sigmoidoscopy or colonoscopy). This is done to check for the earliest forms of colorectal cancer.  Routine screening usually begins at age 48.  Direct examination of the colon should be repeated every 5-10 years through 64 years of age. However, you may need to be screened more often if early forms  of precancerous polyps or small growths are found. Skin Cancer  Check your skin from head to toe regularly.  Tell your health care provider about any new moles or changes in moles, especially if there is a change in a mole's shape or color.  Also tell your health care provider if you have a mole that is larger than the size of a pencil eraser.  Always use sunscreen. Apply sunscreen liberally and repeatedly throughout the day.  Protect yourself by wearing long sleeves, pants, a wide-brimmed hat, and sunglasses whenever you are outside. HEART DISEASE, DIABETES, AND HIGH BLOOD PRESSURE   Have your blood pressure checked at least every 1-2 years. High blood pressure causes heart disease and increases the risk of stroke.  If you are between 64 years and 53 years old, ask your health care provider if you should take aspirin to prevent strokes.  Have regular diabetes screenings. This involves taking a blood sample to check your fasting blood sugar level.  If you are at a normal weight and have a low risk for diabetes, have this test once every three years after 64 years of age.  If you are overweight  and have a high risk for diabetes, consider being tested at a younger age or more often. PREVENTING INFECTION  Hepatitis B  If you have a higher risk for hepatitis B, you should be screened for this virus. You are considered at high risk for hepatitis B if:  You were born in a country where hepatitis B is common. Ask your health care provider which countries are considered high risk.  Your parents were born in a high-risk country, and you have not been immunized against hepatitis B (hepatitis B vaccine).  You have HIV or AIDS.  You use needles to inject street drugs.  You live with someone who has hepatitis B.  You have had sex with someone who has hepatitis B.  You get hemodialysis treatment.  You take certain medicines for conditions, including cancer, organ transplantation, and autoimmune conditions. Hepatitis C  Blood testing is recommended for:  Everyone born from [birth year] through 1965.  Anyone with known risk factors for hepatitis C. Sexually transmitted infections (STIs)  You should be screened for sexually transmitted infections (STIs) including gonorrhea and chlamydia if:  You are sexually active and are younger than 64 years of age.  You are older than 64 years of age and your health care provider tells you that you are at risk for this type of infection.  Your sexual activity has changed since you were last screened and you are at an increased risk for chlamydia or gonorrhea. Ask your health care provider if you are at risk.  If you do not have HIV, but are at risk, it may be recommended that you take a prescription medicine daily to prevent HIV infection. This is called pre-exposure prophylaxis (PrEP). You are considered at risk if:  You are sexually active and do not regularly use condoms or know the HIV status of your partner(s).  You take drugs by injection.  You are sexually active with a partner who has HIV. Talk with your health care provider about whether  you are at high risk of being infected with HIV. If you choose to begin PrEP, you should first be tested for HIV. You should then be tested every 3 months for as long as you are taking PrEP.  PREGNANCY   If you are premenopausal and you may become pregnant, ask your health  care provider about preconception counseling.  If you may become pregnant, take 400 to 800 micrograms (mcg) of folic acid every day.  If you want to prevent pregnancy, talk to your health care provider about birth control (contraception). OSTEOPOROSIS AND MENOPAUSE   Osteoporosis is a disease in which the bones lose minerals and strength with aging. This can result in serious bone fractures. Your risk for osteoporosis can be identified using a bone density scan.  If you are 50 years of age or older, or if you are at risk for osteoporosis and fractures, ask your health care provider if you should be screened.  Ask your health care provider whether you should take a calcium or vitamin D supplement to lower your risk for osteoporosis.  Menopause may have certain physical symptoms and risks.  Hormone replacement therapy may reduce some of these symptoms and risks. Talk to your health care provider about whether hormone replacement therapy is right for you.  HOME CARE INSTRUCTIONS   Schedule regular health, dental, and eye exams.  Stay current with your immunizations.   Do not use any tobacco products including cigarettes, chewing tobacco, or electronic cigarettes.  If you are pregnant, do not drink alcohol.  If you are breastfeeding, limit how much and how often you drink alcohol.  Limit alcohol intake to no more than 1 drink per day for nonpregnant women. One drink equals 12 ounces of beer, 5 ounces of wine, or 1 ounces of hard liquor.  Do not use street drugs.  Do not share needles.  Ask your health care provider for help if you need support or information about quitting drugs.  Tell your health care  provider if you often feel depressed.  Tell your health care provider if you have ever been abused or do not feel safe at home. Document Released: 08/24/2010 Document Revised: 06/25/2013 Document Reviewed: 01/10/2013 [facility] Patient Information 2015 [place], [place]. This information is not intended to replace advice given to you by your health care provider. Make sure you discuss any questions you have with your health care provider.

## 2014-11-26 NOTE — Progress Notes (Signed)
Subjective:    Patient ID: Stacy Sherman, female    DOB: 11-May-1950, 64 y.o.   MRN: [id number]  HPI  Wt Readings from Last 3 Encounters:  11/26/14 170 lb (77.111 kg)  11/07/13 169 lb (76.658 kg)  10/16/12 165 lb (74.844 kg)    Subjective:    Patient ID: Stacy Sherman, female    DOB: Jul 20, 1950, 64 y.o.   MRN: [id number]  HPI 64  -year-old patient who is seen today for an annual health assessment.  She enjoys remarkably good health and is followed annually by gynecology. She has had a screening colonoscopy in 06-02-02 and 2012-06-01 ([facility]). No real concerns or complaints. She takes no chronic medications. She is also followed by ophthalmology Bing Plume) due  to glaucoma which has been well-controlled.  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 Married 2 children nonsmoker.  Retired 06/02/2014  Family history father died  age 37. Mother died age 8 complications of diabetes and chronic kidney disease she was a hemodialysis patient. She remote history of breast cancer late onset as well as obesity and osteoarthritis 3 brothers 2 sisters one sister with abdominal aortic aneurysm as well as a cerebral aneurysm no other family history of cerebral aneurysms also positive for hypertension    Review of Systems  Constitutional: Negative for fever, appetite change, fatigue and unexpected weight change.  HENT: Negative for congestion, dental problem, ear pain, hearing loss, mouth sores, nosebleeds, sinus pressure, sore throat, tinnitus, trouble swallowing and voice change.   Eyes: Negative for photophobia, pain, redness and visual disturbance.  Respiratory: Negative for cough, chest tightness and shortness of breath.     Cardiovascular: Negative for chest pain, palpitations and leg swelling.  Gastrointestinal: Negative for nausea, vomiting, abdominal pain, diarrhea, constipation, blood in stool, abdominal distention and rectal pain.  Genitourinary: Negative for dysuria, urgency, frequency, hematuria, flank pain, vaginal bleeding, vaginal discharge, difficulty urinating, genital sores, vaginal pain, menstrual problem and pelvic pain.  Musculoskeletal: Negative for arthralgias, back pain and neck stiffness.  Skin: Negative for rash.  Neurological: Negative for dizziness, syncope, speech difficulty, weakness, light-headedness, numbness and headaches.  Hematological: Negative for adenopathy. Does not bruise/bleed easily.  Psychiatric/Behavioral: Negative for suicidal ideas, behavioral problems, self-injury, dysphoric mood and agitation. The patient is not nervous/anxious.        Objective:   Physical Exam  Constitutional: She is oriented to person, place, and time. She appears well-developed and well-nourished.  HENT:  Head: Normocephalic and atraumatic.  Right Ear: External ear normal.  Left Ear: External ear normal.  Mouth/Throat: Oropharynx is clear and moist.  Eyes: Conjunctivae and EOM are normal.  Neck: Normal range of motion. Neck supple. No JVD present. No thyromegaly present.  Cardiovascular: Normal rate, regular rhythm, normal heart sounds and intact distal pulses.   No murmur heard. Pulmonary/Chest: Effort normal and breath sounds normal. She has no wheezes. She has no rales.  Abdominal: Soft. Bowel sounds are normal. She exhibits no distension and no mass. There is no tenderness. There is no rebound and no guarding.  Musculoskeletal: Normal range of motion. She exhibits no edema and no tenderness.  Neurological: She is alert and oriented to person, place, and time.  She has normal reflexes. No cranial nerve deficit. She exhibits normal muscle tone. Coordination normal.  Skin: Skin is warm and dry.  No rash noted.  Psychiatric: She has a normal mood and affect. Her behavior is normal.          Assessment & Plan:  Preventive health examination.  We'll continue annual gynecologic followup.Regular exercise encouraged Return in one year or as needed Laboratory update will be reviewed    Review of Systems    as above Objective:   Physical Exam  As above      Assessment & Plan:   As above

## 2014-11-27 ENCOUNTER — Other Ambulatory Visit (HOSPITAL_COMMUNITY)
Admission: RE | Admit: 2014-11-27 | Discharge: 2014-11-27 | Disposition: A | Discharge status: Home / Self Care | Payer: BLUE CROSS/BLUE SHIELD | Source: Ambulatory Visit | Attending: Obstetrics and Gynecology | Admitting: Obstetrics and Gynecology

## 2014-11-27 ENCOUNTER — Other Ambulatory Visit: Payer: Self-pay | Admitting: Obstetrics and Gynecology

## 2014-11-27 DIAGNOSIS — Z01419 Encounter for gynecological examination (general) (routine) without abnormal findings: Secondary | ICD-10-CM | POA: Insufficient documentation

## 2014-11-28 LAB — CYTOLOGY - PAP

## 2015-09-03 ENCOUNTER — Ambulatory Visit (INDEPENDENT_AMBULATORY_CARE_PROVIDER_SITE_OTHER): Payer: Medicare Other | Admitting: Internal Medicine

## 2015-09-03 ENCOUNTER — Encounter: Payer: Self-pay | Admitting: Internal Medicine

## 2015-09-03 VITALS — BP 120/80 | HR 74 | Temp 98.1°F | Resp 20 | Ht 65.5 in | Wt 165.0 lb

## 2015-09-03 DIAGNOSIS — B029 Zoster without complications: Secondary | ICD-10-CM

## 2015-09-03 MED ORDER — HYDROCODONE-ACETAMINOPHEN 5-325 MG PO TABS: 1.0000 | ORAL_TABLET | Freq: Four times a day (QID) | ORAL | Status: DC | PRN

## 2015-09-03 MED ORDER — VALACYCLOVIR HCL 1 G PO TABS: 1000.0000 mg | ORAL_TABLET | Freq: Three times a day (TID) | ORAL | Status: DC

## 2015-09-03 NOTE — Progress Notes (Signed)
   Subjective:    Patient ID: Stacy Sherman, female    DOB: 09-03-1950, 65 y.o.   MRN: [id number]  HPI  65 year old patient who presents today with a rash that has developed involving the left back and left chest wall area. She has had some mild flulike symptoms but no documented fever Her general health is good  Past Medical History  Diagnosis Date  . Arthritis   . Glaucoma   . Heart murmur   . History of chicken pox      Social History   Social History  . Marital Status: Married    Spouse Name: N/A  . Number of Children: N/A  . Years of Education: N/A   Occupational History  . Not on file.   Social History Main Topics  . Smoking status: Never Smoker   . Smokeless tobacco: Never Used  . Alcohol Use: Yes  . Drug Use: No  . Sexual Activity: Not on file   Other Topics Concern  . Not on file   Social History Narrative    Past Surgical History  Procedure Laterality Date  . Dilation and curettage of uterus      Family History  Problem Relation Age of Onset  . Arthritis Neg Hx     family hx  . Cancer Neg Hx     family hx - breast  . Diabetes Neg Hx     family hx  . Hypertension Neg Hx     famiyl hx    No Known Allergies  Current Outpatient Prescriptions on File Prior to Visit  Medication Sig Dispense Refill  . latanoprost (XALATAN) 0.005 % ophthalmic solution Place 1 drop into both eyes at bedtime.     . mometasone (NASONEX) 50 MCG/ACT nasal spray PLACE 2 SPRAYS INTO THE NOSE DAILY. 17 g 11   No current facility-administered medications on file prior to visit.    BP 120/80 mmHg  Pulse 74  Temp(Src) 98.1 F (36.7 C) (Oral)  Resp 20  Ht 5' 5.5" (1.664 m)  Wt 165 lb (74.844 kg)  BMI 27.03 kg/m2  SpO2 98%     Review of Systems  Skin: Positive for rash.       Objective:   Physical Exam  Constitutional: She appears well-developed and well-nourished. No distress.  Skin:  Herpetic rash involving the left posterior back to the midline  spreading to the left lateral chest wall area          Assessment & Plan:   Shingles.  Will treat with acyclovir and analgesics as well as anti-inflammatories  [name], MD

## 2015-09-03 NOTE — Patient Instructions (Signed)

## 2015-09-03 NOTE — Progress Notes (Signed)
Pre visit review using our clinic review tool, if applicable. No additional management support is needed unless otherwise documented below in the visit note. 

## 2015-09-09 ENCOUNTER — Other Ambulatory Visit: Payer: Self-pay | Admitting: Obstetrics and Gynecology

## 2015-09-09 DIAGNOSIS — Z1231 Encounter for screening mammogram for malignant neoplasm of breast: Secondary | ICD-10-CM

## 2015-09-18 DIAGNOSIS — H2513 Age-related nuclear cataract, bilateral: Secondary | ICD-10-CM | POA: Diagnosis not present

## 2015-09-18 DIAGNOSIS — H401131 Primary open-angle glaucoma, bilateral, mild stage: Secondary | ICD-10-CM | POA: Diagnosis not present

## 2015-09-26 DIAGNOSIS — D2272 Melanocytic nevi of left lower limb, including hip: Secondary | ICD-10-CM | POA: Diagnosis not present

## 2015-09-26 DIAGNOSIS — B029 Zoster without complications: Secondary | ICD-10-CM | POA: Diagnosis not present

## 2015-09-26 DIAGNOSIS — D225 Melanocytic nevi of trunk: Secondary | ICD-10-CM | POA: Diagnosis not present

## 2015-09-26 DIAGNOSIS — D692 Other nonthrombocytopenic purpura: Secondary | ICD-10-CM | POA: Diagnosis not present

## 2015-09-26 DIAGNOSIS — L819 Disorder of pigmentation, unspecified: Secondary | ICD-10-CM | POA: Diagnosis not present

## 2015-09-26 DIAGNOSIS — D1801 Hemangioma of skin and subcutaneous tissue: Secondary | ICD-10-CM | POA: Diagnosis not present

## 2015-09-26 DIAGNOSIS — L814 Other melanin hyperpigmentation: Secondary | ICD-10-CM | POA: Diagnosis not present

## 2015-09-26 DIAGNOSIS — L821 Other seborrheic keratosis: Secondary | ICD-10-CM | POA: Diagnosis not present

## 2015-09-26 DIAGNOSIS — Z85828 Personal history of other malignant neoplasm of skin: Secondary | ICD-10-CM | POA: Diagnosis not present

## 2015-09-26 DIAGNOSIS — D2262 Melanocytic nevi of left upper limb, including shoulder: Secondary | ICD-10-CM | POA: Diagnosis not present

## 2015-10-13 ENCOUNTER — Ambulatory Visit
Admission: RE | Admit: 2015-10-13 | Discharge: 2015-10-13 | Disposition: A | Discharge status: Home / Self Care | Payer: Medicare Other | Source: Ambulatory Visit | Attending: Obstetrics and Gynecology | Admitting: Obstetrics and Gynecology

## 2015-10-13 DIAGNOSIS — Z1231 Encounter for screening mammogram for malignant neoplasm of breast: Secondary | ICD-10-CM

## 2015-12-05 ENCOUNTER — Other Ambulatory Visit: Payer: Self-pay | Admitting: Obstetrics and Gynecology

## 2015-12-05 ENCOUNTER — Other Ambulatory Visit (HOSPITAL_COMMUNITY)
Admission: RE | Admit: 2015-12-05 | Discharge: 2015-12-05 | Disposition: A | Discharge status: Home / Self Care | Payer: Medicare Other | Source: Ambulatory Visit | Attending: Obstetrics and Gynecology | Admitting: Obstetrics and Gynecology

## 2015-12-05 DIAGNOSIS — Z1151 Encounter for screening for human papillomavirus (HPV): Secondary | ICD-10-CM | POA: Insufficient documentation

## 2015-12-05 DIAGNOSIS — Z01411 Encounter for gynecological examination (general) (routine) with abnormal findings: Secondary | ICD-10-CM | POA: Insufficient documentation

## 2015-12-05 DIAGNOSIS — Z01419 Encounter for gynecological examination (general) (routine) without abnormal findings: Secondary | ICD-10-CM | POA: Diagnosis not present

## 2015-12-09 LAB — CYTOLOGY - PAP
Diagnosis: NEGATIVE
HPV: NOT DETECTED

## 2015-12-18 ENCOUNTER — Other Ambulatory Visit (INDEPENDENT_AMBULATORY_CARE_PROVIDER_SITE_OTHER): Payer: Medicare Other

## 2015-12-18 DIAGNOSIS — Z0000 Encounter for general adult medical examination without abnormal findings: Secondary | ICD-10-CM

## 2015-12-18 LAB — HEPATIC FUNCTION PANEL
ALT: 22 U/L (ref 0–35)
AST: 23 U/L (ref 0–37)
Albumin: 4 g/dL (ref 3.5–5.2)
Alkaline Phosphatase: 50 U/L (ref 39–117)
BILIRUBIN TOTAL: 0.6 mg/dL (ref 0.2–1.2)
Bilirubin, Direct: 0.1 mg/dL (ref 0.0–0.3)
Total Protein: 6.9 g/dL (ref 6.0–8.3)

## 2015-12-18 LAB — CBC WITH DIFFERENTIAL/PLATELET
BASOS PCT: 0.6 % (ref 0.0–3.0)
Basophils Absolute: 0 10*3/uL (ref 0.0–0.1)
EOS ABS: 0.1 10*3/uL (ref 0.0–0.7)
Eosinophils Relative: 1.8 % (ref 0.0–5.0)
HCT: 40.3 % (ref 36.0–46.0)
Hemoglobin: 13.7 g/dL (ref 12.0–15.0)
Lymphocytes Relative: 45.5 % (ref 12.0–46.0)
Lymphs Abs: 2.6 10*3/uL (ref 0.7–4.0)
MCHC: 34.1 g/dL (ref 30.0–36.0)
MCV: 97.5 fl (ref 78.0–100.0)
MONO ABS: 0.4 10*3/uL (ref 0.1–1.0)
Monocytes Relative: 6.3 % (ref 3.0–12.0)
NEUTROS ABS: 2.6 10*3/uL (ref 1.4–7.7)
Neutrophils Relative %: 45.8 % (ref 43.0–77.0)
PLATELETS: 241 10*3/uL (ref 150.0–400.0)
RBC: 4.13 Mil/uL (ref 3.87–5.11)
RDW: 13.8 % (ref 11.5–15.5)
WBC: 5.7 10*3/uL (ref 4.0–10.5)

## 2015-12-18 LAB — LIPID PANEL
Cholesterol: 193 mg/dL (ref 0–200)
HDL: 61.7 mg/dL (ref >39.00)
LDL CALC: 113 mg/dL — AB (ref 0–99)
NonHDL: 131.56
TRIGLYCERIDES: 93 mg/dL (ref 0.0–149.0)
Total CHOL/HDL Ratio: 3
VLDL: 18.6 mg/dL (ref 0.0–40.0)

## 2015-12-18 LAB — POC URINALSYSI DIPSTICK (AUTOMATED)
Bilirubin, UA: NEGATIVE
GLUCOSE UA: NEGATIVE
KETONES UA: NEGATIVE
Leukocytes, UA: NEGATIVE
Nitrite, UA: NEGATIVE
Protein, UA: NEGATIVE
SPEC GRAV UA: 1.02
UROBILINOGEN UA: 0.2
pH, UA: 5.5

## 2015-12-18 LAB — BASIC METABOLIC PANEL
BUN: 23 mg/dL (ref 6–23)
CALCIUM: 9.4 mg/dL (ref 8.4–10.5)
CO2: 24 mEq/L (ref 19–32)
Chloride: 106 mEq/L (ref 96–112)
Creatinine, Ser: 0.85 mg/dL (ref 0.40–1.20)
GFR: 71.28 mL/min (ref >60.00)
GLUCOSE: 89 mg/dL (ref 70–99)
POTASSIUM: 4.1 meq/L (ref 3.5–5.1)
SODIUM: 142 meq/L (ref 135–145)

## 2015-12-18 LAB — TSH: TSH: 2.74 u[IU]/mL (ref 0.35–4.50)

## 2015-12-25 DIAGNOSIS — Z780 Asymptomatic menopausal state: Secondary | ICD-10-CM | POA: Diagnosis not present

## 2015-12-25 DIAGNOSIS — Z13820 Encounter for screening for osteoporosis: Secondary | ICD-10-CM | POA: Diagnosis not present

## 2015-12-26 ENCOUNTER — Ambulatory Visit (INDEPENDENT_AMBULATORY_CARE_PROVIDER_SITE_OTHER): Payer: Medicare Other | Admitting: Internal Medicine

## 2015-12-26 ENCOUNTER — Encounter: Payer: Self-pay | Admitting: Internal Medicine

## 2015-12-26 VITALS — BP 110/70 | HR 68 | Temp 98.0°F | Resp 18 | Ht 65.5 in | Wt 159.4 lb

## 2015-12-26 DIAGNOSIS — Z8249 Family history of ischemic heart disease and other diseases of the circulatory system: Secondary | ICD-10-CM

## 2015-12-26 DIAGNOSIS — Z0000 Encounter for general adult medical examination without abnormal findings: Secondary | ICD-10-CM | POA: Diagnosis not present

## 2015-12-26 DIAGNOSIS — Z23 Encounter for immunization: Secondary | ICD-10-CM

## 2015-12-26 NOTE — Progress Notes (Signed)
Subjective:    Patient ID: Stacy Sherman, female    DOB: [birth year], 65 y.o.   MRN: [id number]  HPI 47  -year-old patient who is seen today for an annual health assessment.  She enjoys remarkably good health and is followed annually by gynecology. She has had a screening colonoscopy in 06/12/02 and 06/11/2012 ([facility]). No real concerns or complaints. She takes no chronic medications. She is also followed by ophthalmology [facility]) due  to glaucoma which has been well-controlled.  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 She is a gravida 3 para 2 abortus 1 Surgical procedures have included a laparoscopy in 06/12/1986 as part of a infertility evaluation she had a D&C also 14 and normal childbirths  In 20 and 46   Social history. 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 Married 2 children nonsmoker.  Retired Jun 12, 2014  Family history father died  age 21. Mother died age 60 complications of diabetes and chronic kidney disease she was a hemodialysis patient. She remote history of breast cancer late onset as well as obesity and osteoarthritis 3 brothers 2 sisters one sister with abdominal aortic aneurysm as well as a cerebral aneurysm no other family history of cerebral aneurysms also positive for hypertension  Past Medical History:  Diagnosis Date  . Arthritis   . Glaucoma   . Heart murmur   . History of chicken pox      Social History   Social History  . Marital status: Married    Spouse name: N/A  . Number of children: N/A  . Years of education: N/A   Occupational History  . Not on file.   Social History Main Topics  . Smoking status: Never Smoker  . Smokeless tobacco: Never Used  . Alcohol use Yes  . Drug use: No  . Sexual activity: Not on file   Other Topics Concern  . Not on file   Social History Narrative  . No narrative on file    Past Surgical History:  Procedure Laterality Date  . DILATION AND CURETTAGE OF UTERUS      Family History  Problem  Relation Age of Onset  . Arthritis Neg Hx     family hx  . Cancer Neg Hx     family hx - breast  . Diabetes Neg Hx     family hx  . Hypertension Neg Hx     famiyl hx    No Known Allergies  Current Outpatient Prescriptions on File Prior to Visit  Medication Sig Dispense Refill  . latanoprost (XALATAN) 0.005 % ophthalmic solution Place 1 drop into both eyes at bedtime.     . mometasone (NASONEX) 50 MCG/ACT nasal spray PLACE 2 SPRAYS INTO THE NOSE DAILY. 17 g 11   No current facility-administered medications on file prior to visit.     BP 110/70 (BP Location: Left Arm, Patient Position: Sitting, Cuff Size: Normal)   Pulse 68   Temp 98 F (36.7 C) (Oral)   Resp 18   Ht 5' 5.5" (1.664 m)   Wt 159 lb 6.1 oz (72.3 kg)   SpO2 98%   BMI 26.12 kg/m    Medicare wellness  1. Risk factors, based on past  M,S,F history.  No cardiovascular risk factors.  Follow liver 94.  Mother died at 18  2.  Physical activities: Remains active no exercise limitations  3.  Depression/mood: No history depression or mood disorder   4.  Hearing: No  deficits  5.  ADL's: Independent in all aspects of daily living  6.  Fall risk: Low  7.  Home safety: No problems identified  8.  Height weight, and visual acuity; height and weight stable no change in visual acuity is followed by ophthalmology twice annually due to glaucoma  9.  Counseling: Continue heart healthy diet and regular exercise  10. Lab orders based on risk factors: Laboratory studies including lipid panel reviewed  11. Referral : In view of her family history of a sister with AAA, will set her for screening for AAA  12. Care plan: Continue efforts at aggressive risk factor modification  13. Cognitive assessment: Alert and oriented with normal affect no cognitive dysfunction  14. Screening: Patient provided with a written and personalized 5-10 year screening schedule in the AVS.    15. Provider List Update: Dermatology OB/GYN  ophthalmology and primary care      Review of Systems  Constitutional: Negative.   HENT: Negative for congestion, dental problem, hearing loss, rhinorrhea, sinus pressure, sore throat and tinnitus.   Eyes: Negative for pain, discharge and visual disturbance.  Respiratory: Negative for cough and shortness of breath.   Cardiovascular: Negative for chest pain, palpitations and leg swelling.  Gastrointestinal: Negative for abdominal distention, abdominal pain, blood in stool, constipation, diarrhea, nausea and vomiting.  Genitourinary: Negative for difficulty urinating, dysuria, flank pain, frequency, hematuria, pelvic pain, urgency, vaginal bleeding, vaginal discharge and vaginal pain.  Musculoskeletal: Negative for arthralgias, gait problem and joint swelling.  Skin: Negative for rash.  Neurological: Negative for dizziness, syncope, speech difficulty, weakness, numbness and headaches.  Hematological: Negative for adenopathy.  Psychiatric/Behavioral: Negative for agitation, behavioral problems and dysphoric mood. The patient is not nervous/anxious.        Objective:   Physical Exam  Constitutional: She is oriented to person, place, and time. She appears well-developed and well-nourished.  HENT:  Head: Normocephalic and atraumatic.  Right Ear: External ear normal.  Left Ear: External ear normal.  Mouth/Throat: Oropharynx is clear and moist.  Eyes: Conjunctivae and EOM are normal.  Neck: Normal range of motion. Neck supple. No JVD present. No thyromegaly present.  Cardiovascular: Normal rate, regular rhythm, normal heart sounds and intact distal pulses.   No murmur heard. Pulmonary/Chest: Effort normal and breath sounds normal. She has no wheezes. She has no rales.  Abdominal: Soft. Bowel sounds are normal. She exhibits no distension and no mass. There is no tenderness. There is no rebound and no guarding.  Musculoskeletal: Normal range of motion. She exhibits no edema or tenderness.    Neurological: She is alert and oriented to person, place, and time. She has normal reflexes. No cranial nerve deficit. She exhibits normal muscle tone. Coordination normal.  Skin: Skin is warm and dry. No rash noted.  Psychiatric: She has a normal mood and affect. Her behavior is normal.          Assessment & Plan:   Preventive health examination  We'll screen for AAA Follow-up ophthalmology and OB/GYN Colonoscopy 2024 Continue annual mammograms  Return here in one year or as needed Annual flu vaccine and Prevnar 13, dispensed  Lizzete Gough Pilar Plate

## 2015-12-26 NOTE — Patient Instructions (Addendum)
It is important that you exercise regularly, at least 20 minutes 3 to 4 times per week.  If you develop chest pain or shortness of breath seek  medical attention.  Health Maintenance, Female Adopting a healthy lifestyle and getting preventive care can go a long way to promote health and wellness. Talk with your health care provider about what schedule of regular examinations is right for you. This is a good chance for you to check in with your provider about disease prevention and staying healthy. In between checkups, there are plenty of things you can do on your own. Experts have done a lot of research about which lifestyle changes and preventive measures are most likely to keep you healthy. Ask your health care provider for more information. WEIGHT AND DIET  Eat a healthy diet  Be sure to include plenty of vegetables, fruits, low-fat dairy products, and lean protein.  Do not eat a lot of foods high in solid fats, added sugars, or salt.  Get regular exercise. This is one of the most important things you can do for your health.  Most adults should exercise for at least 150 minutes each week. The exercise should increase your heart rate and make you sweat (moderate-intensity exercise).  Most adults should also do strengthening exercises at least twice a week. This is in addition to the moderate-intensity exercise.  Maintain a healthy weight  Body mass index (BMI) is a measurement that can be used to identify possible weight problems. It estimates body fat based on height and weight. Your health care provider can help determine your BMI and help you achieve or maintain a healthy weight.  For females 20 years of age and older:   A BMI below 18.5 is considered underweight.  A BMI of 18.5 to 24.9 is normal.  A BMI of 25 to 29.9 is considered overweight.  A BMI of 30 and above is considered obese.  Watch levels of cholesterol and blood lipids  You should start having your blood tested  for lipids and cholesterol at 65 years of age, then have this test every 5 years.  You may need to have your cholesterol levels checked more often if:  Your lipid or cholesterol levels are high.  You are older than 65 years of age.  You are at high risk for heart disease.  CANCER SCREENING   Lung Cancer  Lung cancer screening is recommended for adults 55-80 years old who are at high risk for lung cancer because of a history of smoking.  A yearly low-dose CT scan of the lungs is recommended for people who:  Currently smoke.  Have quit within the past 15 years.  Have at least a 30-pack-year history of smoking. A pack year is smoking an average of one pack of cigarettes a day for 1 year.  Yearly screening should continue until it has been 15 years since you quit.  Yearly screening should stop if you develop a health problem that would prevent you from having lung cancer treatment.  Breast Cancer  Practice breast self-awareness. This means understanding how your breasts normally appear and feel.  It also means doing regular breast self-exams. Let your health care provider know about any changes, no matter how small.  If you are in your 20s or 30s, you should have a clinical breast exam (CBE) by a health care provider every 1-3 years as part of a regular health exam.  If you are 40 or older, have a CBE every   year. Also consider having a breast X-ray (mammogram) every year.  If you have a family history of breast cancer, talk to your health care provider about genetic screening.  If you are at high risk for breast cancer, talk to your health care provider about having an MRI and a mammogram every year.  Breast cancer gene (BRCA) assessment is recommended for women who have family members with BRCA-related cancers. BRCA-related cancers include:  Breast.  Ovarian.  Tubal.  Peritoneal cancers.  Results of the assessment will determine the need for genetic counseling and  BRCA1 and BRCA2 testing. Cervical Cancer Your health care provider may recommend that you be screened regularly for cancer of the pelvic organs (ovaries, uterus, and vagina). This screening involves a pelvic examination, including checking for microscopic changes to the surface of your cervix (Pap test). You may be encouraged to have this screening done every 3 years, beginning at age 21.  For women ages 30-65, health care providers may recommend pelvic exams and Pap testing every 3 years, or they may recommend the Pap and pelvic exam, combined with testing for human papilloma virus (HPV), every 5 years. Some types of HPV increase your risk of cervical cancer. Testing for HPV may also be done on women of any age with unclear Pap test results.  Other health care providers may not recommend any screening for nonpregnant women who are considered low risk for pelvic cancer and who do not have symptoms. Ask your health care provider if a screening pelvic exam is right for you.  If you have had past treatment for cervical cancer or a condition that could lead to cancer, you need Pap tests and screening for cancer for at least 20 years after your treatment. If Pap tests have been discontinued, your risk factors (such as having a new sexual partner) need to be reassessed to determine if screening should resume. Some women have medical problems that increase the chance of getting cervical cancer. In these cases, your health care provider may recommend more frequent screening and Pap tests. Colorectal Cancer  This type of cancer can be detected and often prevented.  Routine colorectal cancer screening usually begins at 65 years of age and continues through 65 years of age.  Your health care provider may recommend screening at an earlier age if you have risk factors for colon cancer.  Your health care provider may also recommend using home test kits to check for hidden blood in the stool.  A small camera at  the end of a tube can be used to examine your colon directly (sigmoidoscopy or colonoscopy). This is done to check for the earliest forms of colorectal cancer.  Routine screening usually begins at age 50.  Direct examination of the colon should be repeated every 5-10 years through 65 years of age. However, you may need to be screened more often if early forms of precancerous polyps or small growths are found. Skin Cancer  Check your skin from head to toe regularly.  Tell your health care provider about any new moles or changes in moles, especially if there is a change in a mole's shape or color.  Also tell your health care provider if you have a mole that is larger than the size of a pencil eraser.  Always use sunscreen. Apply sunscreen liberally and repeatedly throughout the day.  Protect yourself by wearing long sleeves, pants, a wide-brimmed hat, and sunglasses whenever you are outside. HEART DISEASE, DIABETES, AND HIGH BLOOD PRESSURE     High blood pressure causes heart disease and increases the risk of stroke. High blood pressure is more likely to develop in:  People who have blood pressure in the high end of the normal range (130-139/85-89 mm Hg).  People who are overweight or obese.  People who are African American.  If you are 18-39 years of age, have your blood pressure checked every 3-5 years. If you are 40 years of age or older, have your blood pressure checked every year. You should have your blood pressure measured twice--once when you are at a hospital or clinic, and once when you are not at a hospital or clinic. Record the average of the two measurements. To check your blood pressure when you are not at a hospital or clinic, you can use:  An automated blood pressure machine at a pharmacy.  A home blood pressure monitor.  If you are between 55 years and 79 years old, ask your health care provider if you should take aspirin to prevent strokes.  Have regular diabetes  screenings. This involves taking a blood sample to check your fasting blood sugar level.  If you are at a normal weight and have a low risk for diabetes, have this test once every three years after 65 years of age.  If you are overweight and have a high risk for diabetes, consider being tested at a younger age or more often. PREVENTING INFECTION  Hepatitis B  If you have a higher risk for hepatitis B, you should be screened for this virus. You are considered at high risk for hepatitis B if:  You were born in a country where hepatitis B is common. Ask your health care provider which countries are considered high risk.  Your parents were born in a high-risk country, and you have not been immunized against hepatitis B (hepatitis B vaccine).  You have HIV or AIDS.  You use needles to inject street drugs.  You live with someone who has hepatitis B.  You have had sex with someone who has hepatitis B.  You get hemodialysis treatment.  You take certain medicines for conditions, including cancer, organ transplantation, and autoimmune conditions. Hepatitis C  Blood testing is recommended for:  Everyone born from 1945 through 1965.  Anyone with known risk factors for hepatitis C. Sexually transmitted infections (STIs)  You should be screened for sexually transmitted infections (STIs) including gonorrhea and chlamydia if:  You are sexually active and are younger than 65 years of age.  You are older than 65 years of age and your health care provider tells you that you are at risk for this type of infection.  Your sexual activity has changed since you were last screened and you are at an increased risk for chlamydia or gonorrhea. Ask your health care provider if you are at risk.  If you do not have HIV, but are at risk, it may be recommended that you take a prescription medicine daily to prevent HIV infection. This is called pre-exposure prophylaxis (PrEP). You are considered at risk  if:  You are sexually active and do not regularly use condoms or know the HIV status of your partner(s).  You take drugs by injection.  You are sexually active with a partner who has HIV. Talk with your health care provider about whether you are at high risk of being infected with HIV. If you choose to begin PrEP, you should first be tested for HIV. You should then be tested every 3 months for as   long as you are taking PrEP.  PREGNANCY   If you are premenopausal and you may become pregnant, ask your health care provider about preconception counseling.  If you may become pregnant, take 400 to 800 micrograms (mcg) of folic acid every day.  If you want to prevent pregnancy, talk to your health care provider about birth control (contraception). OSTEOPOROSIS AND MENOPAUSE   Osteoporosis is a disease in which the bones lose minerals and strength with aging. This can result in serious bone fractures. Your risk for osteoporosis can be identified using a bone density scan.  If you are 30 years of age or older, or if you are at risk for osteoporosis and fractures, ask your health care provider if you should be screened.  Ask your health care provider whether you should take a calcium or vitamin D supplement to lower your risk for osteoporosis.  Menopause may have certain physical symptoms and risks.  Hormone replacement therapy may reduce some of these symptoms and risks. Talk to your health care provider about whether hormone replacement therapy is right for you.  HOME CARE INSTRUCTIONS   Schedule regular health, dental, and eye exams.  Stay current with your immunizations.   Do not use any tobacco products including cigarettes, chewing tobacco, or electronic cigarettes.  If you are pregnant, do not drink alcohol.  If you are breastfeeding, limit how much and how often you drink alcohol.  Limit alcohol intake to no more than 1 drink per day for nonpregnant women. One drink equals 12  ounces of beer, 5 ounces of wine, or 1 ounces of hard liquor.  Do not use street drugs.  Do not share needles.  Ask your health care provider for help if you need support or information about quitting drugs.  Tell your health care provider if you often feel depressed.  Tell your health care provider if you have ever been abused or do not feel safe at home.   This information is not intended to replace advice given to you by your health care provider. Make sure you discuss any questions you have with your health care provider.   Document Released: 08/24/2010 Document Revised: 03/01/2014 Document Reviewed: 01/10/2013 Elsevier Interactive Patient Education [employer].

## 2015-12-26 NOTE — Progress Notes (Signed)
Pre visit review using our clinic review tool, if applicable. No additional management support is needed unless otherwise documented below in the visit note. 

## 2016-01-09 ENCOUNTER — Ambulatory Visit (HOSPITAL_COMMUNITY)
Admission: RE | Admit: 2016-01-09 | Discharge: 2016-01-09 | Disposition: A | Discharge status: Home / Self Care | Payer: Medicare Other | Source: Ambulatory Visit | Attending: Cardiovascular Disease | Admitting: Cardiovascular Disease

## 2016-01-09 ENCOUNTER — Other Ambulatory Visit: Payer: Self-pay | Admitting: Internal Medicine

## 2016-01-09 DIAGNOSIS — Z0000 Encounter for general adult medical examination without abnormal findings: Secondary | ICD-10-CM

## 2016-01-09 DIAGNOSIS — Z136 Encounter for screening for cardiovascular disorders: Secondary | ICD-10-CM | POA: Diagnosis not present

## 2016-01-09 DIAGNOSIS — Z8249 Family history of ischemic heart disease and other diseases of the circulatory system: Secondary | ICD-10-CM | POA: Diagnosis not present

## 2016-01-14 DIAGNOSIS — R05 Cough: Secondary | ICD-10-CM | POA: Diagnosis not present

## 2016-01-26 ENCOUNTER — Encounter: Payer: Self-pay | Admitting: Internal Medicine

## 2016-01-26 ENCOUNTER — Ambulatory Visit (INDEPENDENT_AMBULATORY_CARE_PROVIDER_SITE_OTHER): Payer: Medicare Other | Admitting: Internal Medicine

## 2016-01-26 VITALS — BP 144/80 | HR 65 | Temp 97.8°F | Ht 65.5 in | Wt 162.0 lb

## 2016-01-26 DIAGNOSIS — J069 Acute upper respiratory infection, unspecified: Secondary | ICD-10-CM

## 2016-01-26 DIAGNOSIS — J22 Unspecified acute lower respiratory infection: Secondary | ICD-10-CM

## 2016-01-26 MED ORDER — DOXYCYCLINE HYCLATE 100 MG PO TABS: 100.0000 mg | ORAL_TABLET | Freq: Two times a day (BID) | ORAL | 0 refills | Status: DC

## 2016-01-26 MED ORDER — HYDROCODONE-HOMATROPINE 5-1.5 MG/5ML PO SYRP: ORAL_SOLUTION | ORAL | 0 refills | Status: DC

## 2016-01-26 NOTE — Progress Notes (Signed)
Pre visit review using our clinic review tool, if applicable. No additional management support is needed unless otherwise documented below in the visit note.  Chief Complaint  Patient presents with  . Cough    X2wks  . Post Nasal Drip  . Nasal Congestion  . Sore Throat    HPI: Stacy Sherman 65 y.o.  sda appt PCP NA  She comes in today with over 2 week history of symptoms. Onset like a head cold and was concerned about a sinus infection since off urgent care person before Thanksgiving and was told she had a viral respiratory infection and to use symptomatic treatment. She's been using Sudafed and Mucinex cough suppressant tea and honey and her symptoms have persisted and after initial improvement has started to get worse again. She coughs very hard and has left-sided chest pain she thinks from coughing hard. Her nose is clogged only mild facial pressure. Discharges clear to occasional yellow but no hemoptysis. No fever and chills some sore throat. She is coughing most nights and feels miserable. No history of asthma doesn't smoke. After 2 weeks she thought she would come in because she isn't feeling better.   ROS: See pertinent positives and negatives per HPI.  Past Medical History:  Diagnosis Date  . Arthritis   . Glaucoma   . Heart murmur   . History of chicken pox     Family History  Problem Relation Age of Onset  . Arthritis Neg Hx     family hx  . Cancer Neg Hx     family hx - breast  . Diabetes Neg Hx     family hx  . Hypertension Neg Hx     famiyl hx    Social History   Social History  . Marital status: Married    Spouse name: N/A  . Number of children: N/A  . Years of education: N/A   Social History Main Topics  . Smoking status: Never Smoker  . Smokeless tobacco: Never Used  . Alcohol use Yes  . Drug use: No  . Sexual activity: Not Asked   Other Topics Concern  . None   Social History Narrative  . None    Outpatient Medications Prior to Visit   Medication Sig Dispense Refill  . latanoprost (XALATAN) 0.005 % ophthalmic solution Place 1 drop into both eyes at bedtime.     . mometasone (NASONEX) 50 MCG/ACT nasal spray PLACE 2 SPRAYS INTO THE NOSE DAILY. 17 g 5   No facility-administered medications prior to visit.      EXAM:  BP (!) 144/80 (BP Location: Right Arm, Patient Position: Sitting, Cuff Size: Normal)   Pulse 65   Temp 97.8 F (36.6 C) (Oral)   Ht 5' 5.5" (1.664 m)   Wt 162 lb (73.5 kg)   BMI 26.55 kg/m   Body mass index is 26.55 kg/m. I WDWN in NAD  quiet respirations;  congested  somewhat hoarse. Non toxic .Intermittent bronchial cough. HEENT: Normocephalic ;atraumatic , Eyes;  PERRL, EOMs  Full, lids and conjunctiva clear,,Ears: no deformities, canals nl, TM landmarks normal, Nose: no deformity or discharge but congested;face minimally tender Mouth : OP clear without lesion or edema . Neck: Supple without adenopathy or masses or bruits Chest:  No wheeze or rales question of large airway sounds breath sounds are equal otherwise. CV:  S1-S2 no gallops or murmurs peripheral perfusion is normal Skin :nl perfusion and no acute rashes    ASSESSMENT AND PLAN:  Discussed  the following assessment and plan:  Lower respiratory infection (e.g., bronchitis, pneumonia, pneumonitis, pulmonitis) - Plan: DG Chest 2 View  Protracted URI Ongoing what sounds like initially viral illness some relapsing symptoms although exam is reassuring today not sure about left-sided breath sounds. Expectant management offered cough suppressant at night for comfort. Printed prescription for antibiotic begin if not getting better or if x-rays positive. -Patient advised to return or notify health care team  if symptoms worsen ,persist or new concerns arise.  Patient Instructions  Cause medicine for comfort If not improving in the next couple days can add the antibiotic. However because of the left-sided chest pain consider getting a chest  x-ray and if there is pneumonia begin the antibiotic right away.   The fact that you have relapsing symptoms is a bit more concerning for a secondary infection but your exam is reassuring.    Standley Brooking. Viona Hosking M.D.

## 2016-01-26 NOTE — Patient Instructions (Signed)
Cause medicine for comfort If not improving in the next couple days can add the antibiotic. However because of the left-sided chest pain consider getting a chest x-ray and if there is pneumonia begin the antibiotic right away.   The fact that you have relapsing symptoms is a bit more concerning for a secondary infection but your exam is reassuring.

## 2016-01-27 ENCOUNTER — Ambulatory Visit (INDEPENDENT_AMBULATORY_CARE_PROVIDER_SITE_OTHER)
Admission: RE | Admit: 2016-01-27 | Discharge: 2016-01-27 | Disposition: A | Discharge status: Home / Self Care | Payer: Medicare Other | Source: Ambulatory Visit | Attending: Internal Medicine | Admitting: Internal Medicine

## 2016-01-27 DIAGNOSIS — J22 Unspecified acute lower respiratory infection: Secondary | ICD-10-CM

## 2016-01-27 DIAGNOSIS — R05 Cough: Secondary | ICD-10-CM | POA: Diagnosis not present

## 2016-04-21 DIAGNOSIS — H2513 Age-related nuclear cataract, bilateral: Secondary | ICD-10-CM | POA: Diagnosis not present

## 2016-04-21 DIAGNOSIS — H401131 Primary open-angle glaucoma, bilateral, mild stage: Secondary | ICD-10-CM | POA: Diagnosis not present

## 2016-04-21 DIAGNOSIS — H524 Presbyopia: Secondary | ICD-10-CM | POA: Diagnosis not present

## 2016-09-06 ENCOUNTER — Other Ambulatory Visit: Payer: Self-pay | Admitting: Obstetrics and Gynecology

## 2016-09-06 DIAGNOSIS — Z1231 Encounter for screening mammogram for malignant neoplasm of breast: Secondary | ICD-10-CM

## 2016-10-01 ENCOUNTER — Other Ambulatory Visit: Payer: Self-pay | Admitting: Internal Medicine

## 2016-10-13 ENCOUNTER — Ambulatory Visit
Admission: RE | Admit: 2016-10-13 | Discharge: 2016-10-13 | Disposition: A | Discharge status: Home / Self Care | Payer: Medicare Other | Source: Ambulatory Visit | Attending: Obstetrics and Gynecology | Admitting: Obstetrics and Gynecology

## 2016-10-13 DIAGNOSIS — Z1231 Encounter for screening mammogram for malignant neoplasm of breast: Secondary | ICD-10-CM | POA: Diagnosis not present

## 2016-10-21 DIAGNOSIS — H524 Presbyopia: Secondary | ICD-10-CM | POA: Diagnosis not present

## 2016-10-21 DIAGNOSIS — H401131 Primary open-angle glaucoma, bilateral, mild stage: Secondary | ICD-10-CM | POA: Diagnosis not present

## 2016-10-21 DIAGNOSIS — H2513 Age-related nuclear cataract, bilateral: Secondary | ICD-10-CM | POA: Diagnosis not present

## 2016-12-03 DIAGNOSIS — D485 Neoplasm of uncertain behavior of skin: Secondary | ICD-10-CM | POA: Diagnosis not present

## 2016-12-03 DIAGNOSIS — D2272 Melanocytic nevi of left lower limb, including hip: Secondary | ICD-10-CM | POA: Diagnosis not present

## 2016-12-03 DIAGNOSIS — L700 Acne vulgaris: Secondary | ICD-10-CM | POA: Diagnosis not present

## 2016-12-03 DIAGNOSIS — L812 Freckles: Secondary | ICD-10-CM | POA: Diagnosis not present

## 2016-12-03 DIAGNOSIS — D2271 Melanocytic nevi of right lower limb, including hip: Secondary | ICD-10-CM | POA: Diagnosis not present

## 2016-12-03 DIAGNOSIS — L821 Other seborrheic keratosis: Secondary | ICD-10-CM | POA: Diagnosis not present

## 2016-12-03 DIAGNOSIS — L819 Disorder of pigmentation, unspecified: Secondary | ICD-10-CM | POA: Diagnosis not present

## 2016-12-03 DIAGNOSIS — D1801 Hemangioma of skin and subcutaneous tissue: Secondary | ICD-10-CM | POA: Diagnosis not present

## 2016-12-03 DIAGNOSIS — D225 Melanocytic nevi of trunk: Secondary | ICD-10-CM | POA: Diagnosis not present

## 2016-12-03 DIAGNOSIS — L814 Other melanin hyperpigmentation: Secondary | ICD-10-CM | POA: Diagnosis not present

## 2016-12-03 DIAGNOSIS — D2261 Melanocytic nevi of right upper limb, including shoulder: Secondary | ICD-10-CM | POA: Diagnosis not present

## 2016-12-03 DIAGNOSIS — D2262 Melanocytic nevi of left upper limb, including shoulder: Secondary | ICD-10-CM | POA: Diagnosis not present

## 2017-01-04 ENCOUNTER — Ambulatory Visit (INDEPENDENT_AMBULATORY_CARE_PROVIDER_SITE_OTHER): Payer: Medicare Other | Admitting: Internal Medicine

## 2017-01-04 ENCOUNTER — Encounter: Payer: Self-pay | Admitting: Internal Medicine

## 2017-01-04 VITALS — BP 116/72 | HR 73 | Temp 98.1°F | Ht 65.5 in | Wt 164.6 lb

## 2017-01-04 DIAGNOSIS — N951 Menopausal and female climacteric states: Secondary | ICD-10-CM

## 2017-01-04 DIAGNOSIS — Z0000 Encounter for general adult medical examination without abnormal findings: Secondary | ICD-10-CM

## 2017-01-04 DIAGNOSIS — Z23 Encounter for immunization: Secondary | ICD-10-CM | POA: Diagnosis not present

## 2017-01-04 DIAGNOSIS — E785 Hyperlipidemia, unspecified: Secondary | ICD-10-CM

## 2017-01-04 LAB — LIPID PANEL
CHOLESTEROL: 225 mg/dL — AB (ref 0–200)
HDL: 67.3 mg/dL (ref >39.00)
LDL Cholesterol: 138 mg/dL — ABNORMAL HIGH (ref 0–99)
NONHDL: 157.92
Total CHOL/HDL Ratio: 3
Triglycerides: 100 mg/dL (ref 0.0–149.0)
VLDL: 20 mg/dL (ref 0.0–40.0)

## 2017-01-04 LAB — CBC WITH DIFFERENTIAL/PLATELET
BASOS PCT: 0.8 % (ref 0.0–3.0)
Basophils Absolute: 0 10*3/uL (ref 0.0–0.1)
EOS PCT: 1.2 % (ref 0.0–5.0)
Eosinophils Absolute: 0.1 10*3/uL (ref 0.0–0.7)
HCT: 43.1 % (ref 36.0–46.0)
HEMOGLOBIN: 14.6 g/dL (ref 12.0–15.0)
LYMPHS ABS: 2.3 10*3/uL (ref 0.7–4.0)
Lymphocytes Relative: 42.9 % (ref 12.0–46.0)
MCHC: 33.9 g/dL (ref 30.0–36.0)
MCV: 99.4 fl (ref 78.0–100.0)
MONOS PCT: 6.2 % (ref 3.0–12.0)
Monocytes Absolute: 0.3 10*3/uL (ref 0.1–1.0)
Neutro Abs: 2.6 10*3/uL (ref 1.4–7.7)
Neutrophils Relative %: 48.9 % (ref 43.0–77.0)
Platelets: 276 10*3/uL (ref 150.0–400.0)
RBC: 4.34 Mil/uL (ref 3.87–5.11)
RDW: 13 % (ref 11.5–15.5)
WBC: 5.4 10*3/uL (ref 4.0–10.5)

## 2017-01-04 LAB — COMPREHENSIVE METABOLIC PANEL
ALBUMIN: 4.3 g/dL (ref 3.5–5.2)
ALK PHOS: 59 U/L (ref 39–117)
ALT: 17 U/L (ref 0–35)
AST: 16 U/L (ref 0–37)
BILIRUBIN TOTAL: 0.6 mg/dL (ref 0.2–1.2)
BUN: 21 mg/dL (ref 6–23)
CO2: 29 mEq/L (ref 19–32)
Calcium: 10 mg/dL (ref 8.4–10.5)
Chloride: 100 mEq/L (ref 96–112)
Creatinine, Ser: 0.84 mg/dL (ref 0.40–1.20)
GFR: 72.03 mL/min (ref >60.00)
GLUCOSE: 90 mg/dL (ref 70–99)
POTASSIUM: 4.1 meq/L (ref 3.5–5.1)
Sodium: 138 mEq/L (ref 135–145)
TOTAL PROTEIN: 7.4 g/dL (ref 6.0–8.3)

## 2017-01-04 LAB — TSH: TSH: 2.39 u[IU]/mL (ref 0.35–4.50)

## 2017-01-04 NOTE — Progress Notes (Signed)
Subjective:    Patient ID: Stacy Sherman, female    DOB: 11-25-1950, 66 y.o.   MRN: [id number]  HPI 66 year old patient who is seen today for a preventive health examination and subsequent Medicare wellness visit.  She is followed annually by gynecology.  Did have a mammogram earlier this year. She has a sister who has had both an aortic in cerebral artery aneurysm and she did have a AAA screen 1 year ago. She is followed by dermatology and also by ophthalmology due to glaucoma.  No other concerns or complaints  Past Medical History:  Diagnosis Date  . Arthritis   . Glaucoma   . Heart murmur   . History of chicken pox      Social History   Socioeconomic History  . Marital status: Married    Spouse name: Not on file  . Number of children: Not on file  . Years of education: Not on file  . Highest education level: Not on file  Social Needs  . Financial resource strain: Not on file  . Food insecurity - worry: Not on file  . Food insecurity - inability: Not on file  . Transportation needs - medical: Not on file  . Transportation needs - non-medical: Not on file  Occupational History  . Not on file  Tobacco Use  . Smoking status: Never Smoker  . Smokeless tobacco: Never Used  Substance and Sexual Activity  . Alcohol use: Yes  . Drug use: No  . Sexual activity: Not on file  Other Topics Concern  . Not on file  Social History Narrative  . Not on file    Past Surgical History:  Procedure Laterality Date  . DILATION AND CURETTAGE OF UTERUS      Family History  Problem Relation Age of Onset  . Arthritis Neg Hx        family hx  . Cancer Neg Hx        family hx - breast  . Diabetes Neg Hx        family hx  . Hypertension Neg Hx        famiyl hx    No Known Allergies  Current Outpatient Medications on File Prior to Visit  Medication Sig Dispense Refill  . latanoprost (XALATAN) 0.005 % ophthalmic solution Place 1 drop into both eyes at bedtime.     .  mometasone (NASONEX) 50 MCG/ACT nasal spray PLACE 2 SPRAYS INTO THE NOSE DAILY. 17 g 5   No current facility-administered medications on file prior to visit.     BP 116/72 (BP Location: Left Arm, Patient Position: Sitting, Cuff Size: Normal)   Pulse 73   Temp 98.1 F (36.7 C) (Oral)   Ht 5' 5.5" (1.664 m)   Wt 164 lb 9.6 oz (74.7 kg)   SpO2 98%   BMI 26.97 kg/m   Subsequent Medicare wellness visit   1. Risk factors, based on past  M,S,F history.  No cardiovascular risk factors  2.  Physical activities: Remains quite active with tennis walking and does some resistance training.  No exercise limitations  3.  Depression/mood: No history of depression or mood disorder  4.  Hearing: No deficits  5.  ADL's: Independent 6.  Fall risk: Low  7.  Home safety: No problems identified 8.  Height weight, and visual acuity; height and weight stable no change in visual acuity is followed at least twice annually for glaucoma by ophthalmology  9.  Counseling: Continue active lifestyle  and heart healthy diet  10. Lab orders based on risk factors: Laboratory update will be reviewed  11. Referral : Gynecology and ophthalmology  12. Care plan: Continue efforts at aggressive risk factor modification continue annual mammograms.  Patient did have a follow-up colonoscopy 2014  13. Cognitive assessment: Alert and appropriate normal affect no cognitive dysfunction 14. Screening: Patient provided with a written and personalized 5-10 year screening schedule in the AVS.    15. Provider List Update: Primary care ophthalmology dermatology gynecology and GI     Review of Systems  Constitutional: Negative.   HENT: Negative for congestion, dental problem, hearing loss, rhinorrhea, sinus pressure, sore throat and tinnitus.   Eyes: Negative for pain, discharge and visual disturbance.  Respiratory: Negative for cough and shortness of breath.   Cardiovascular: Negative for chest pain, palpitations and  leg swelling.  Gastrointestinal: Negative for abdominal distention, abdominal pain, blood in stool, constipation, diarrhea, nausea and vomiting.  Genitourinary: Negative for difficulty urinating, dysuria, flank pain, frequency, hematuria, pelvic pain, urgency, vaginal bleeding, vaginal discharge and vaginal pain.  Musculoskeletal: Negative for arthralgias, gait problem and joint swelling.  Skin: Negative for rash.  Neurological: Negative for dizziness, syncope, speech difficulty, weakness, numbness and headaches.  Hematological: Negative for adenopathy.  Psychiatric/Behavioral: Negative for agitation, behavioral problems and dysphoric mood. The patient is not nervous/anxious.        Objective:   Physical Exam  Constitutional: She is oriented to person, place, and time. She appears well-developed and well-nourished.  Weight 164 Blood pressure low normal  HENT:  Head: Normocephalic and atraumatic.  Right Ear: External ear normal.  Left Ear: External ear normal.  Mouth/Throat: Oropharynx is clear and moist.  Eyes: Conjunctivae and EOM are normal.  Neck: Normal range of motion. Neck supple. No JVD present. No thyromegaly present.  Cardiovascular: Normal rate, regular rhythm, normal heart sounds and intact distal pulses.  No murmur heard. Pulmonary/Chest: Effort normal and breath sounds normal. She has no wheezes. She has no rales.  Abdominal: Soft. Bowel sounds are normal. She exhibits no distension and no mass. There is no tenderness. There is no rebound and no guarding.  Musculoskeletal: Normal range of motion. She exhibits no edema or tenderness.  Neurological: She is alert and oriented to person, place, and time. She has normal reflexes. No cranial nerve deficit. She exhibits normal muscle tone. Coordination normal.  Skin: Skin is warm and dry. No rash noted.  Psychiatric: She has a normal mood and affect. Her behavior is normal.          Assessment & Plan:  Subsequent Medicare  wellness visit  History of glaucoma.  Follow-up ophthalmology  We will check screening labs including antibody for hepatitis C Pneumovax administered Flu vaccine administered  Stacy Sherman

## 2017-01-04 NOTE — Patient Instructions (Addendum)
It is important that you exercise regularly, at least 20 minutes 3 to 4 times per week.  If you develop chest pain or shortness of breath seek  medical attention.   Health Maintenance for Postmenopausal Women Menopause is a normal process in which your reproductive ability comes to an end. This process happens gradually over a span of months to years, usually between the ages of 84 and 53. Menopause is complete when you have missed 12 consecutive menstrual periods. It is important to talk with your health care provider about some of the most common conditions that affect postmenopausal women, such as heart disease, cancer, and bone loss (osteoporosis). Adopting a healthy lifestyle and getting preventive care can help to promote your health and wellness. Those actions can also lower your chances of developing some of these common conditions. What should I know about menopause? During menopause, you may experience a number of symptoms, such as:  Moderate-to-severe hot flashes.  Night sweats.  Decrease in sex drive.  Mood swings.  Headaches.  Tiredness.  Irritability.  Memory problems.  Insomnia.  Choosing to treat or not to treat menopausal changes is an individual decision that you make with your health care provider. What should I know about hormone replacement therapy and supplements? Hormone therapy products are effective for treating symptoms that are associated with menopause, such as hot flashes and night sweats. Hormone replacement carries certain risks, especially as you become older. If you are thinking about using estrogen or estrogen with progestin treatments, discuss the benefits and risks with your health care provider. What should I know about heart disease and stroke? Heart disease, heart attack, and stroke become more likely as you age. This may be due, in part, to the hormonal changes that your body experiences during menopause. These can affect how your body  processes dietary fats, triglycerides, and cholesterol. Heart attack and stroke are both medical emergencies. There are many things that you can do to help prevent heart disease and stroke:  Have your blood pressure checked at least every 1-2 years. High blood pressure causes heart disease and increases the risk of stroke.  If you are 101-66 years old, ask your health care provider if you should take aspirin to prevent a heart attack or a stroke.  Do not use any tobacco products, including cigarettes, chewing tobacco, or electronic cigarettes. If you need help quitting, ask your health care provider.  It is important to eat a healthy diet and maintain a healthy weight. ? Be sure to include plenty of vegetables, fruits, low-fat dairy products, and lean protein. ? Avoid eating foods that are high in solid fats, added sugars, or salt (sodium).  Get regular exercise. This is one of the most important things that you can do for your health. ? Try to exercise for at least 150 minutes each week. The type of exercise that you do should increase your heart rate and make you sweat. This is known as moderate-intensity exercise. ? Try to do strengthening exercises at least twice each week. Do these in addition to the moderate-intensity exercise.  Know your numbers.Ask your health care provider to check your cholesterol and your blood glucose. Continue to have your blood tested as directed by your health care provider.  What should I know about cancer screening? There are several types of cancer. Take the following steps to reduce your risk and to catch any cancer development as early as possible. Breast Cancer  Practice breast self-awareness. ? This means understanding  how your breasts normally appear and feel. ? It also means doing regular breast self-exams. Let your health care provider know about any changes, no matter how small.  If you are 40 or older, have a clinician do a breast exam (clinical  breast exam or CBE) every year. Depending on your age, family history, and medical history, it may be recommended that you also have a yearly breast X-ray (mammogram).  If you have a family history of breast cancer, talk with your health care provider about genetic screening.  If you are at high risk for breast cancer, talk with your health care provider about having an MRI and a mammogram every year.  Breast cancer (BRCA) gene test is recommended for women who have family members with BRCA-related cancers. Results of the assessment will determine the need for genetic counseling and BRCA1 and for BRCA2 testing. BRCA-related cancers include these types: ? Breast. This occurs in males or females. ? Ovarian. ? Tubal. This may also be called fallopian tube cancer. ? Cancer of the abdominal or pelvic lining (peritoneal cancer). ? Prostate. ? Pancreatic.  Cervical, Uterine, and Ovarian Cancer Your health care provider may recommend that you be screened regularly for cancer of the pelvic organs. These include your ovaries, uterus, and vagina. This screening involves a pelvic exam, which includes checking for microscopic changes to the surface of your cervix (Pap test).  For women ages 21-65, health care providers may recommend a pelvic exam and a Pap test every three years. For women ages 8-65, they may recommend the Pap test and pelvic exam, combined with testing for human papilloma virus (HPV), every five years. Some types of HPV increase your risk of cervical cancer. Testing for HPV may also be done on women of any age who have unclear Pap test results.  Other health care providers may not recommend any screening for nonpregnant women who are considered low risk for pelvic cancer and have no symptoms. Ask your health care provider if a screening pelvic exam is right for you.  If you have had past treatment for cervical cancer or a condition that could lead to cancer, you need Pap tests and  screening for cancer for at least 20 years after your treatment. If Pap tests have been discontinued for you, your risk factors (such as having a new sexual partner) need to be reassessed to determine if you should start having screenings again. Some women have medical problems that increase the chance of getting cervical cancer. In these cases, your health care provider may recommend that you have screening and Pap tests more often.  If you have a family history of uterine cancer or ovarian cancer, talk with your health care provider about genetic screening.  If you have vaginal bleeding after reaching menopause, tell your health care provider.  There are currently no reliable tests available to screen for ovarian cancer.  Lung Cancer Lung cancer screening is recommended for adults 33-42 years old who are at high risk for lung cancer because of a history of smoking. A yearly low-dose CT scan of the lungs is recommended if you:  Currently smoke.  Have a history of at least 30 pack-years of smoking and you currently smoke or have quit within the past 15 years. A pack-year is smoking an average of one pack of cigarettes per day for one year.  Yearly screening should:  Continue until it has been 15 years since you quit.  Stop if you develop a health problem  that would prevent you from having lung cancer treatment.  Colorectal Cancer  This type of cancer can be detected and can often be prevented.  Routine colorectal cancer screening usually begins at age 87 and continues through age 13.  If you have risk factors for colon cancer, your health care provider may recommend that you be screened at an earlier age.  If you have a family history of colorectal cancer, talk with your health care provider about genetic screening.  Your health care provider may also recommend using home test kits to check for hidden blood in your stool.  A small camera at the end of a tube can be used to examine  your colon directly (sigmoidoscopy or colonoscopy). This is done to check for the earliest forms of colorectal cancer.  Direct examination of the colon should be repeated every 5-10 years until age 57. However, if early forms of precancerous polyps or small growths are found or if you have a family history or genetic risk for colorectal cancer, you may need to be screened more often.  Skin Cancer  Check your skin from head to toe regularly.  Monitor any moles. Be sure to tell your health care provider: ? About any new moles or changes in moles, especially if there is a change in a mole's shape or color. ? If you have a mole that is larger than the size of a pencil eraser.  If any of your family members has a history of skin cancer, especially at a young age, talk with your health care provider about genetic screening.  Always use sunscreen. Apply sunscreen liberally and repeatedly throughout the day.  Whenever you are outside, protect yourself by wearing long sleeves, pants, a wide-brimmed hat, and sunglasses.  What should I know about osteoporosis? Osteoporosis is a condition in which bone destruction happens more quickly than new bone creation. After menopause, you may be at an increased risk for osteoporosis. To help prevent osteoporosis or the bone fractures that can happen because of osteoporosis, the following is recommended:  If you are 26-84 years old, get at least 1,000 mg of calcium and at least 600 mg of vitamin D per day.  If you are older than age 1 but younger than age 74, get at least 1,200 mg of calcium and at least 600 mg of vitamin D per day.  If you are older than age 23, get at least 1,200 mg of calcium and at least 800 mg of vitamin D per day.  Smoking and excessive alcohol intake increase the risk of osteoporosis. Eat foods that are rich in calcium and vitamin D, and do weight-bearing exercises several times each week as directed by your health care provider. What  should I know about how menopause affects my mental health? Depression may occur at any age, but it is more common as you become older. Common symptoms of depression include:  Low or sad mood.  Changes in sleep patterns.  Changes in appetite or eating patterns.  Feeling an overall lack of motivation or enjoyment of activities that you previously enjoyed.  Frequent crying spells.  Talk with your health care provider if you think that you are experiencing depression. What should I know about immunizations? It is important that you get and maintain your immunizations. These include:  Tetanus, diphtheria, and pertussis (Tdap) booster vaccine.  Influenza every year before the flu season begins.  Pneumonia vaccine.  Shingles vaccine.  Your health care provider may also recommend other  immunizations. This information is not intended to replace advice given to you by your health care provider. Make sure you discuss any questions you have with your health care provider. Document Released: 04/02/2005 Document Revised: 08/29/2015 Document Reviewed: 11/12/2014 Elsevier Interactive Patient Education  2018 [employer].

## 2017-01-05 LAB — HEPATITIS C ANTIBODY
Hepatitis C Ab: NONREACTIVE
SIGNAL TO CUT-OFF: 0.01 (ref <1.00)

## 2017-01-26 DIAGNOSIS — Z01419 Encounter for gynecological examination (general) (routine) without abnormal findings: Secondary | ICD-10-CM | POA: Diagnosis not present

## 2017-02-16 ENCOUNTER — Other Ambulatory Visit: Payer: Self-pay | Admitting: Internal Medicine

## 2017-02-24 DIAGNOSIS — H401131 Primary open-angle glaucoma, bilateral, mild stage: Secondary | ICD-10-CM | POA: Diagnosis not present

## 2017-02-24 DIAGNOSIS — H2513 Age-related nuclear cataract, bilateral: Secondary | ICD-10-CM | POA: Diagnosis not present

## 2017-02-24 DIAGNOSIS — H524 Presbyopia: Secondary | ICD-10-CM | POA: Diagnosis not present

## 2017-07-12 DIAGNOSIS — H2513 Age-related nuclear cataract, bilateral: Secondary | ICD-10-CM | POA: Diagnosis not present

## 2017-07-12 DIAGNOSIS — H401131 Primary open-angle glaucoma, bilateral, mild stage: Secondary | ICD-10-CM | POA: Diagnosis not present

## 2017-08-23 DIAGNOSIS — D2272 Melanocytic nevi of left lower limb, including hip: Secondary | ICD-10-CM | POA: Diagnosis not present

## 2017-08-23 DIAGNOSIS — Z85828 Personal history of other malignant neoplasm of skin: Secondary | ICD-10-CM | POA: Diagnosis not present

## 2017-08-23 DIAGNOSIS — X32XXXA Exposure to sunlight, initial encounter: Secondary | ICD-10-CM | POA: Diagnosis not present

## 2017-08-23 DIAGNOSIS — Z808 Family history of malignant neoplasm of other organs or systems: Secondary | ICD-10-CM | POA: Diagnosis not present

## 2017-08-23 DIAGNOSIS — L578 Other skin changes due to chronic exposure to nonionizing radiation: Secondary | ICD-10-CM | POA: Diagnosis not present

## 2017-08-23 DIAGNOSIS — Z08 Encounter for follow-up examination after completed treatment for malignant neoplasm: Secondary | ICD-10-CM | POA: Diagnosis not present

## 2017-08-23 DIAGNOSIS — L568 Other specified acute skin changes due to ultraviolet radiation: Secondary | ICD-10-CM | POA: Diagnosis not present

## 2017-08-23 DIAGNOSIS — D225 Melanocytic nevi of trunk: Secondary | ICD-10-CM | POA: Diagnosis not present

## 2017-08-30 DIAGNOSIS — Z808 Family history of malignant neoplasm of other organs or systems: Secondary | ICD-10-CM | POA: Diagnosis not present

## 2017-08-30 DIAGNOSIS — Z0000 Encounter for general adult medical examination without abnormal findings: Secondary | ICD-10-CM | POA: Diagnosis not present

## 2017-10-12 DIAGNOSIS — L259 Unspecified contact dermatitis, unspecified cause: Secondary | ICD-10-CM | POA: Diagnosis not present

## 2017-10-12 DIAGNOSIS — L578 Other skin changes due to chronic exposure to nonionizing radiation: Secondary | ICD-10-CM | POA: Diagnosis not present

## 2017-10-21 DIAGNOSIS — Z1231 Encounter for screening mammogram for malignant neoplasm of breast: Secondary | ICD-10-CM | POA: Diagnosis not present

## 2017-11-21 DIAGNOSIS — Z23 Encounter for immunization: Secondary | ICD-10-CM | POA: Diagnosis not present

## 2018-03-03 DIAGNOSIS — H538 Other visual disturbances: Secondary | ICD-10-CM | POA: Diagnosis not present

## 2018-03-03 DIAGNOSIS — H2513 Age-related nuclear cataract, bilateral: Secondary | ICD-10-CM | POA: Diagnosis not present

## 2018-03-03 DIAGNOSIS — H401131 Primary open-angle glaucoma, bilateral, mild stage: Secondary | ICD-10-CM | POA: Diagnosis not present

## 2018-04-09 IMAGING — DX DG CHEST 2V
2 series · 2 of 2 positions shown · non-contrast
Comparison: None in PACs

CLINICAL DATA: Cough, chest congestion, and wheezing for the past 2
weeks. History of mitral valve prolapse. Nonsmoker.

EXAM:
CHEST  2 VIEW

[chest pa]
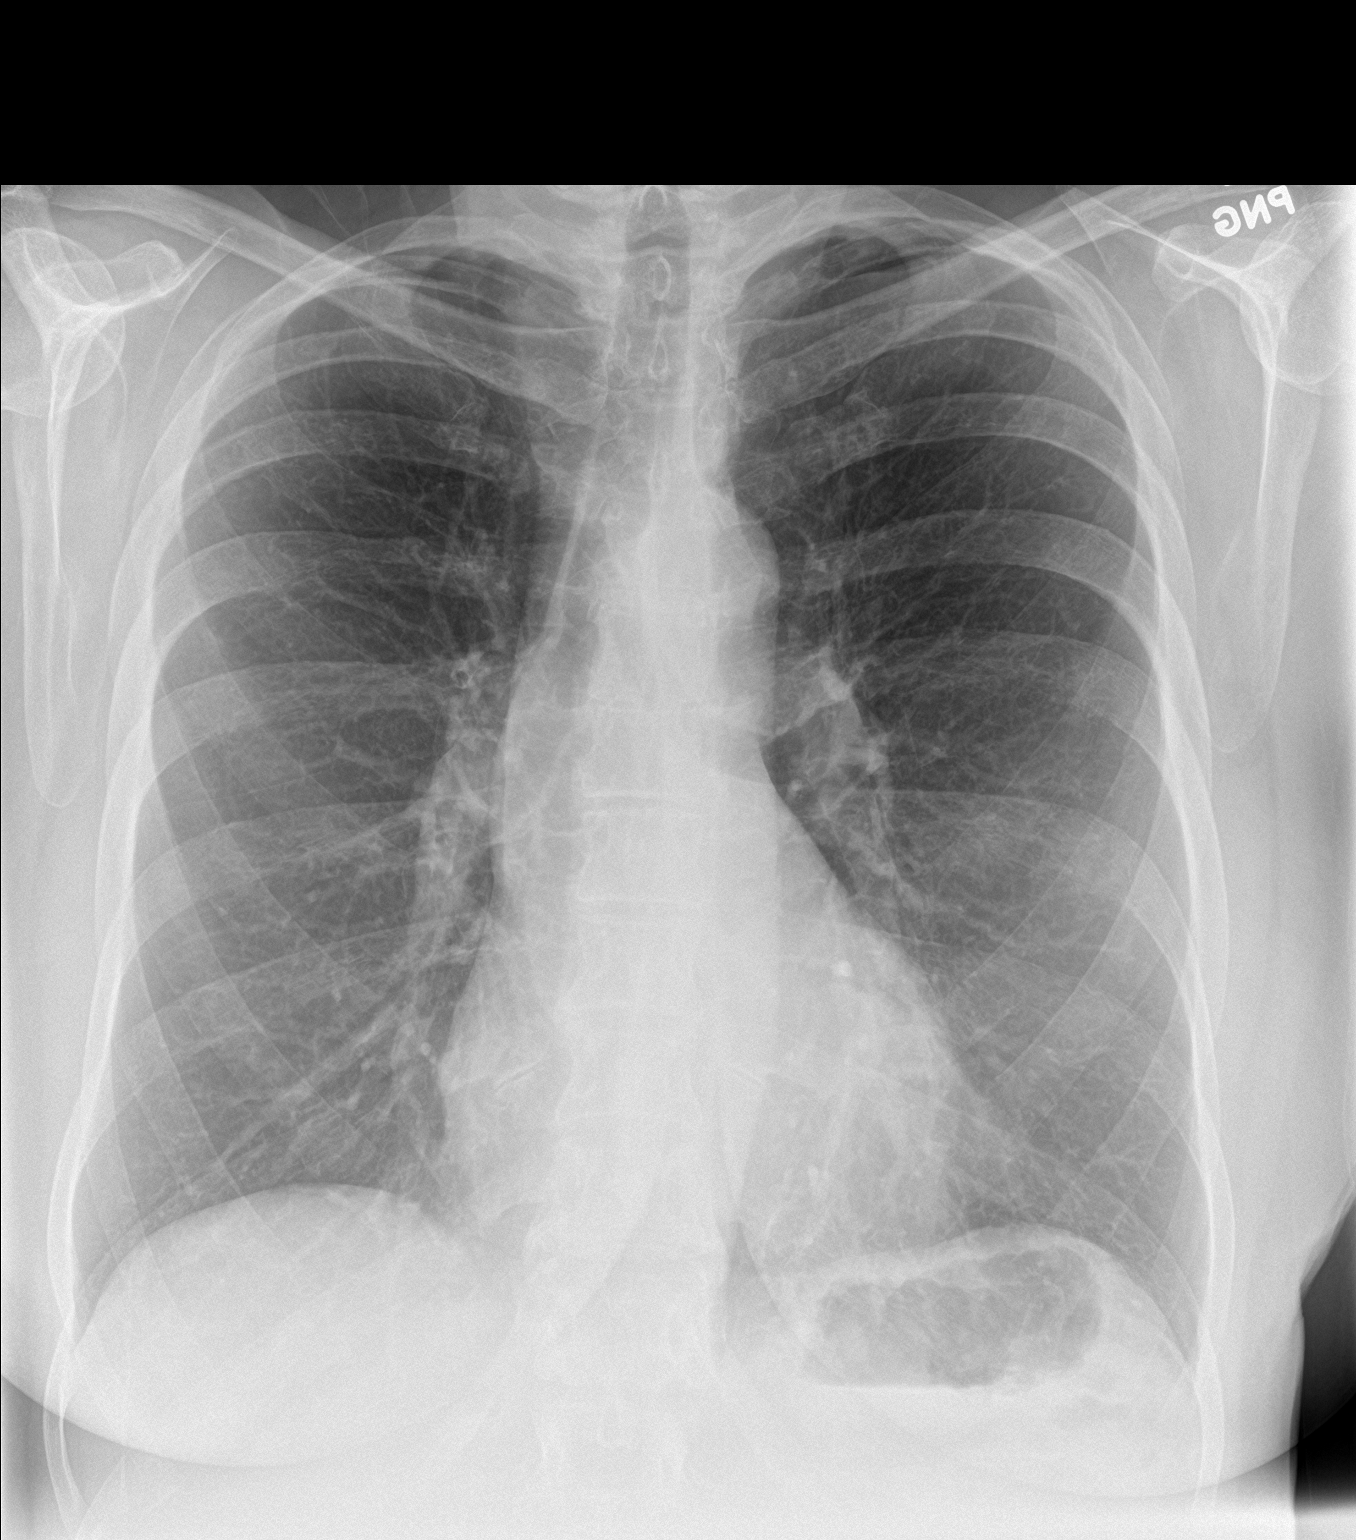

[chest lat]
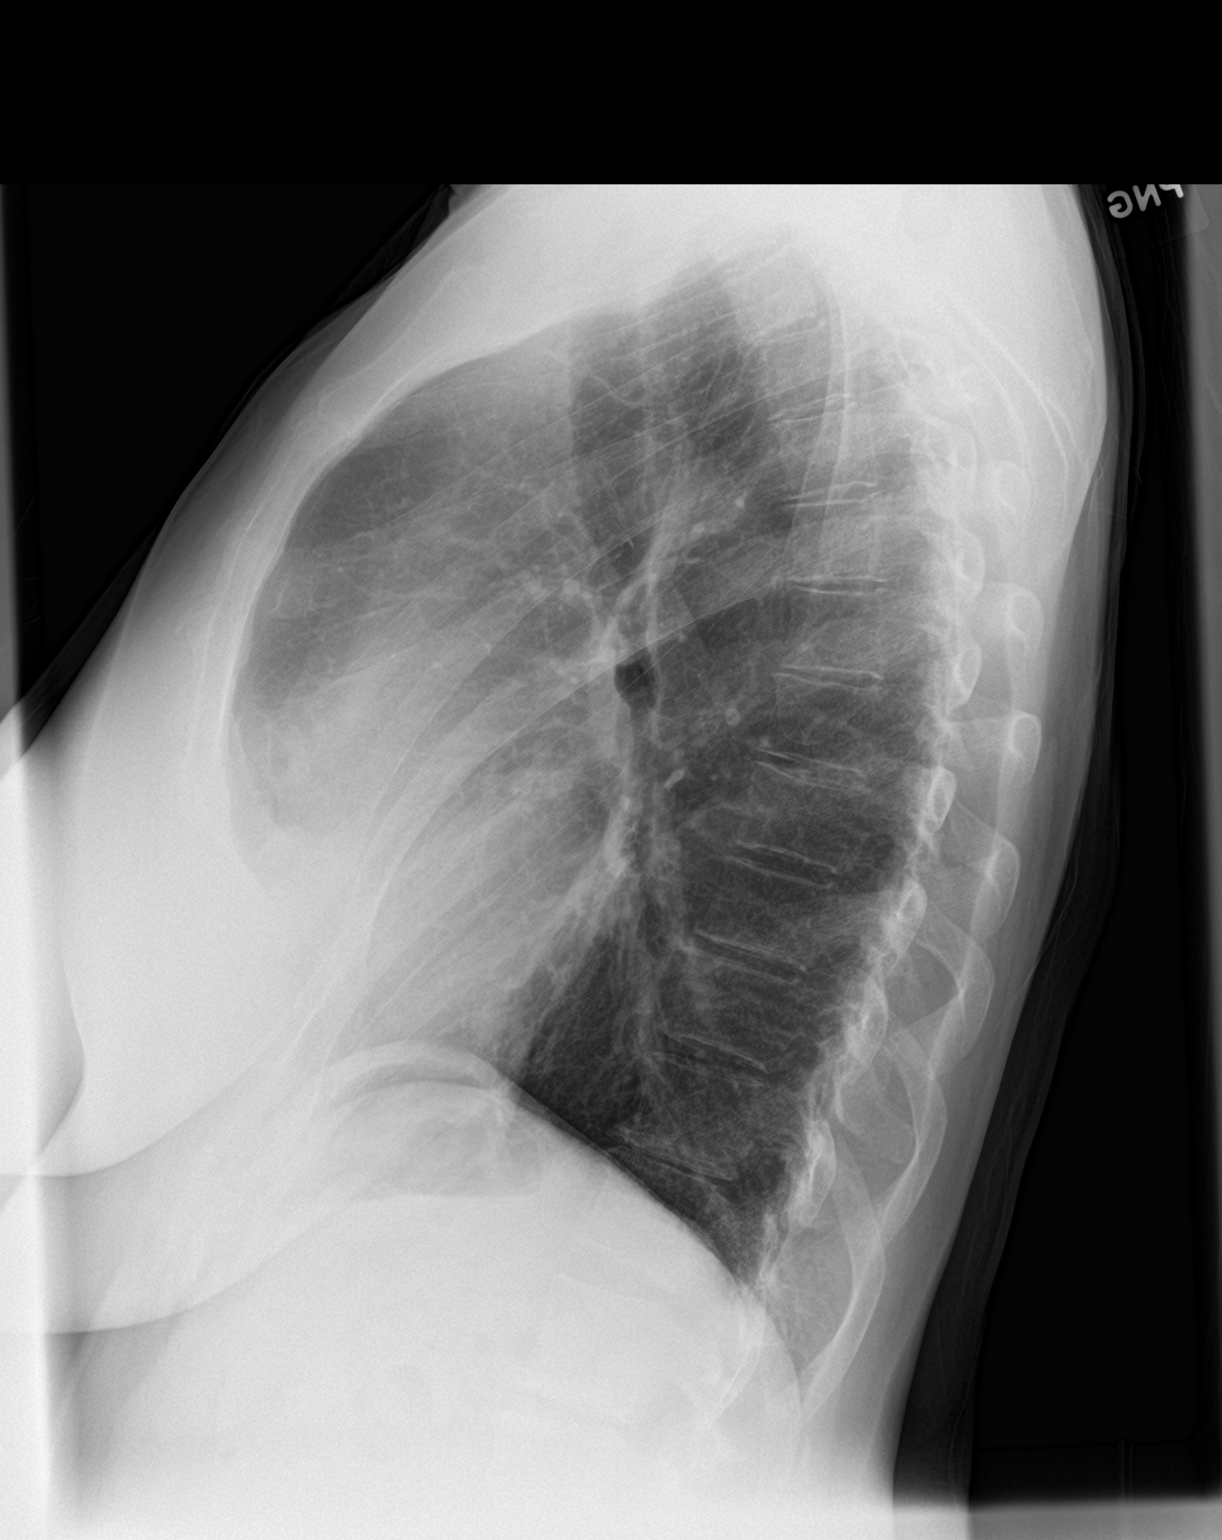

[2 of 2 positions shown; findings below may reference images not displayed]

FINDINGS: The lungs are mildly hyperinflated without hemidiaphragm flattening.
There is no focal infiltrate. There is no pleural effusion. The
heart and pulmonary vascularity are normal. The mediastinum is
normal in width. The bony thorax is unremarkable.
IMPRESSION: Mild hyperinflation may be voluntary or may reflect acute
bronchitis. There is no pneumonia nor other acute cardiopulmonary
abnormality.

## 2018-06-21 DIAGNOSIS — M2011 Hallux valgus (acquired), right foot: Secondary | ICD-10-CM | POA: Diagnosis not present

## 2018-06-21 DIAGNOSIS — M2012 Hallux valgus (acquired), left foot: Secondary | ICD-10-CM | POA: Diagnosis not present

## 2018-06-21 DIAGNOSIS — M7742 Metatarsalgia, left foot: Secondary | ICD-10-CM | POA: Diagnosis not present

## 2018-06-21 DIAGNOSIS — M2042 Other hammer toe(s) (acquired), left foot: Secondary | ICD-10-CM | POA: Diagnosis not present

## 2018-06-21 DIAGNOSIS — M2041 Other hammer toe(s) (acquired), right foot: Secondary | ICD-10-CM | POA: Diagnosis not present

## 2018-06-21 DIAGNOSIS — M7741 Metatarsalgia, right foot: Secondary | ICD-10-CM | POA: Diagnosis not present

## 2018-07-06 DIAGNOSIS — R05 Cough: Secondary | ICD-10-CM | POA: Diagnosis not present

## 2018-07-06 DIAGNOSIS — U071 COVID-19: Secondary | ICD-10-CM | POA: Diagnosis not present

## 2018-07-10 DIAGNOSIS — M2041 Other hammer toe(s) (acquired), right foot: Secondary | ICD-10-CM | POA: Diagnosis not present

## 2018-07-10 DIAGNOSIS — G8918 Other acute postprocedural pain: Secondary | ICD-10-CM | POA: Diagnosis not present

## 2018-07-10 DIAGNOSIS — M216X1 Other acquired deformities of right foot: Secondary | ICD-10-CM | POA: Diagnosis not present

## 2018-07-10 DIAGNOSIS — M2011 Hallux valgus (acquired), right foot: Secondary | ICD-10-CM | POA: Diagnosis not present

## 2018-07-10 DIAGNOSIS — M7741 Metatarsalgia, right foot: Secondary | ICD-10-CM | POA: Diagnosis not present

## 2018-07-10 DIAGNOSIS — M25374 Other instability, right foot: Secondary | ICD-10-CM | POA: Diagnosis not present

## 2018-07-26 DIAGNOSIS — Z4802 Encounter for removal of sutures: Secondary | ICD-10-CM | POA: Diagnosis not present

## 2018-07-26 DIAGNOSIS — Z4789 Encounter for other orthopedic aftercare: Secondary | ICD-10-CM | POA: Diagnosis not present

## 2018-08-29 DIAGNOSIS — Z4789 Encounter for other orthopedic aftercare: Secondary | ICD-10-CM | POA: Diagnosis not present

## 2018-08-31 DIAGNOSIS — H2513 Age-related nuclear cataract, bilateral: Secondary | ICD-10-CM | POA: Diagnosis not present

## 2018-08-31 DIAGNOSIS — H401131 Primary open-angle glaucoma, bilateral, mild stage: Secondary | ICD-10-CM | POA: Diagnosis not present

## 2018-09-01 DIAGNOSIS — Z0000 Encounter for general adult medical examination without abnormal findings: Secondary | ICD-10-CM | POA: Diagnosis not present

## 2018-09-01 DIAGNOSIS — Z136 Encounter for screening for cardiovascular disorders: Secondary | ICD-10-CM | POA: Diagnosis not present

## 2018-10-03 DIAGNOSIS — Z4789 Encounter for other orthopedic aftercare: Secondary | ICD-10-CM | POA: Diagnosis not present

## 2018-11-15 DIAGNOSIS — Z23 Encounter for immunization: Secondary | ICD-10-CM | POA: Diagnosis not present

## 2018-11-16 DIAGNOSIS — Z808 Family history of malignant neoplasm of other organs or systems: Secondary | ICD-10-CM | POA: Diagnosis not present

## 2018-11-16 DIAGNOSIS — L578 Other skin changes due to chronic exposure to nonionizing radiation: Secondary | ICD-10-CM | POA: Diagnosis not present

## 2018-11-16 DIAGNOSIS — Z85828 Personal history of other malignant neoplasm of skin: Secondary | ICD-10-CM | POA: Diagnosis not present

## 2018-11-16 DIAGNOSIS — Z09 Encounter for follow-up examination after completed treatment for conditions other than malignant neoplasm: Secondary | ICD-10-CM | POA: Diagnosis not present

## 2018-11-16 DIAGNOSIS — Z872 Personal history of diseases of the skin and subcutaneous tissue: Secondary | ICD-10-CM | POA: Diagnosis not present

## 2018-11-16 DIAGNOSIS — D2272 Melanocytic nevi of left lower limb, including hip: Secondary | ICD-10-CM | POA: Diagnosis not present

## 2018-11-16 DIAGNOSIS — L821 Other seborrheic keratosis: Secondary | ICD-10-CM | POA: Diagnosis not present

## 2018-11-16 DIAGNOSIS — Z08 Encounter for follow-up examination after completed treatment for malignant neoplasm: Secondary | ICD-10-CM | POA: Diagnosis not present

## 2018-11-16 DIAGNOSIS — D225 Melanocytic nevi of trunk: Secondary | ICD-10-CM | POA: Diagnosis not present

## 2018-11-16 DIAGNOSIS — X32XXXA Exposure to sunlight, initial encounter: Secondary | ICD-10-CM | POA: Diagnosis not present

## 2018-11-16 DIAGNOSIS — L568 Other specified acute skin changes due to ultraviolet radiation: Secondary | ICD-10-CM | POA: Diagnosis not present

## 2018-11-30 DIAGNOSIS — L568 Other specified acute skin changes due to ultraviolet radiation: Secondary | ICD-10-CM | POA: Diagnosis not present

## 2018-12-20 DIAGNOSIS — Z803 Family history of malignant neoplasm of breast: Secondary | ICD-10-CM | POA: Diagnosis not present

## 2018-12-20 DIAGNOSIS — Z1231 Encounter for screening mammogram for malignant neoplasm of breast: Secondary | ICD-10-CM | POA: Diagnosis not present

## 2019-01-11 DIAGNOSIS — L568 Other specified acute skin changes due to ultraviolet radiation: Secondary | ICD-10-CM | POA: Diagnosis not present

## 2019-02-09 DIAGNOSIS — Z7189 Other specified counseling: Secondary | ICD-10-CM | POA: Diagnosis not present

## 2019-02-09 DIAGNOSIS — Z20828 Contact with and (suspected) exposure to other viral communicable diseases: Secondary | ICD-10-CM | POA: Diagnosis not present

## 2019-03-20 DIAGNOSIS — Z4789 Encounter for other orthopedic aftercare: Secondary | ICD-10-CM | POA: Diagnosis not present

## 2019-03-20 DIAGNOSIS — Z4802 Encounter for removal of sutures: Secondary | ICD-10-CM | POA: Diagnosis not present

## 2019-04-03 DIAGNOSIS — H2513 Age-related nuclear cataract, bilateral: Secondary | ICD-10-CM | POA: Diagnosis not present

## 2019-04-03 DIAGNOSIS — H401131 Primary open-angle glaucoma, bilateral, mild stage: Secondary | ICD-10-CM | POA: Diagnosis not present

## 2019-04-03 DIAGNOSIS — H43812 Vitreous degeneration, left eye: Secondary | ICD-10-CM | POA: Diagnosis not present

## 2019-04-18 DIAGNOSIS — Z4789 Encounter for other orthopedic aftercare: Secondary | ICD-10-CM | POA: Diagnosis not present

## 2019-04-20 DIAGNOSIS — Z23 Encounter for immunization: Secondary | ICD-10-CM | POA: Diagnosis not present

## 2019-05-15 DIAGNOSIS — Z4789 Encounter for other orthopedic aftercare: Secondary | ICD-10-CM | POA: Diagnosis not present

## 2019-06-12 DIAGNOSIS — M2041 Other hammer toe(s) (acquired), right foot: Secondary | ICD-10-CM | POA: Diagnosis not present

## 2019-06-12 DIAGNOSIS — M2042 Other hammer toe(s) (acquired), left foot: Secondary | ICD-10-CM | POA: Diagnosis not present

## 2019-10-02 DIAGNOSIS — H2513 Age-related nuclear cataract, bilateral: Secondary | ICD-10-CM | POA: Diagnosis not present

## 2019-10-02 DIAGNOSIS — H401131 Primary open-angle glaucoma, bilateral, mild stage: Secondary | ICD-10-CM | POA: Diagnosis not present

## 2019-11-12 DIAGNOSIS — Z872 Personal history of diseases of the skin and subcutaneous tissue: Secondary | ICD-10-CM | POA: Diagnosis not present

## 2019-11-12 DIAGNOSIS — Z08 Encounter for follow-up examination after completed treatment for malignant neoplasm: Secondary | ICD-10-CM | POA: Diagnosis not present

## 2019-11-12 DIAGNOSIS — L570 Actinic keratosis: Secondary | ICD-10-CM | POA: Diagnosis not present

## 2019-11-12 DIAGNOSIS — L578 Other skin changes due to chronic exposure to nonionizing radiation: Secondary | ICD-10-CM | POA: Diagnosis not present

## 2019-11-12 DIAGNOSIS — L814 Other melanin hyperpigmentation: Secondary | ICD-10-CM | POA: Diagnosis not present

## 2019-11-12 DIAGNOSIS — X32XXXA Exposure to sunlight, initial encounter: Secondary | ICD-10-CM | POA: Diagnosis not present

## 2019-11-12 DIAGNOSIS — Z85828 Personal history of other malignant neoplasm of skin: Secondary | ICD-10-CM | POA: Diagnosis not present

## 2019-11-12 DIAGNOSIS — D225 Melanocytic nevi of trunk: Secondary | ICD-10-CM | POA: Diagnosis not present

## 2019-11-12 DIAGNOSIS — L821 Other seborrheic keratosis: Secondary | ICD-10-CM | POA: Diagnosis not present

## 2019-11-12 DIAGNOSIS — D2272 Melanocytic nevi of left lower limb, including hip: Secondary | ICD-10-CM | POA: Diagnosis not present

## 2019-11-12 DIAGNOSIS — Z09 Encounter for follow-up examination after completed treatment for conditions other than malignant neoplasm: Secondary | ICD-10-CM | POA: Diagnosis not present

## 2019-11-19 DIAGNOSIS — Z23 Encounter for immunization: Secondary | ICD-10-CM | POA: Diagnosis not present

## 2019-11-26 DIAGNOSIS — Z23 Encounter for immunization: Secondary | ICD-10-CM | POA: Diagnosis not present

## 2019-12-21 DIAGNOSIS — Z1231 Encounter for screening mammogram for malignant neoplasm of breast: Secondary | ICD-10-CM | POA: Diagnosis not present

## 2019-12-26 DIAGNOSIS — L570 Actinic keratosis: Secondary | ICD-10-CM | POA: Diagnosis not present

## 2019-12-26 DIAGNOSIS — Z08 Encounter for follow-up examination after completed treatment for malignant neoplasm: Secondary | ICD-10-CM | POA: Diagnosis not present

## 2019-12-26 DIAGNOSIS — B001 Herpesviral vesicular dermatitis: Secondary | ICD-10-CM | POA: Diagnosis not present
